# Patient Record
Sex: Male | Born: 1990 | Race: White | Hispanic: No | Marital: Single | Smoking: Never smoker
Health system: Southern US, Community
[De-identification: ages and names within clinical notes are randomized; demographics above are authoritative.]

## PROBLEM LIST (undated history)

## (undated) DIAGNOSIS — K219 Gastro-esophageal reflux disease without esophagitis: Secondary | ICD-10-CM

## (undated) DIAGNOSIS — J45909 Unspecified asthma, uncomplicated: Secondary | ICD-10-CM

## (undated) DIAGNOSIS — I10 Essential (primary) hypertension: Secondary | ICD-10-CM

## (undated) DIAGNOSIS — T7840XA Allergy, unspecified, initial encounter: Secondary | ICD-10-CM

## (undated) HISTORY — DX: Gastro-esophageal reflux disease without esophagitis: K21.9

## (undated) HISTORY — DX: Allergy, unspecified, initial encounter: T78.40XA

## (undated) HISTORY — DX: Essential (primary) hypertension: I10

## (undated) HISTORY — PX: WISDOM TOOTH EXTRACTION: SHX21

## (undated) HISTORY — DX: Unspecified asthma, uncomplicated: J45.909

---
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
## 2014-03-05 ENCOUNTER — Ambulatory Visit (INDEPENDENT_AMBULATORY_CARE_PROVIDER_SITE_OTHER): Payer: Commercial Indemnity | Admitting: Internal Medicine

## 2014-03-05 ENCOUNTER — Encounter: Payer: Self-pay | Admitting: Internal Medicine

## 2014-03-05 VITALS — BP 142/96 | HR 77 | Temp 98.4°F | Ht 71.75 in | Wt 205.5 lb

## 2014-03-05 DIAGNOSIS — Z0000 Encounter for general adult medical examination without abnormal findings: Secondary | ICD-10-CM

## 2014-03-05 DIAGNOSIS — R062 Wheezing: Secondary | ICD-10-CM

## 2014-03-05 MED ORDER — ALBUTEROL SULFATE HFA 108 (90 BASE) MCG/ACT IN AERS: 1.0000 | INHALATION_SPRAY | Freq: Four times a day (QID) | RESPIRATORY_TRACT | Status: DC | PRN

## 2014-03-05 NOTE — Progress Notes (Signed)
Pre visit review using our clinic review tool, if applicable. No additional management support is needed unless otherwise documented below in the visit note. 

## 2014-03-05 NOTE — Progress Notes (Signed)
HPI Pt presents to the clinic today to establish care. He is transferring care from Dr. Sherryll [place]Shah in [place]. He has no concerns today.  Flu: 3013 Tetanus: unsure of date Dentist: biannually  Past Medical History  Diagnosis Date  . Allergy   . GERD (gastroesophageal reflux disease)   . Hypertension     Current Outpatient Prescriptions  Medication Sig Dispense Refill  . albuterol (PROVENTIL HFA;VENTOLIN HFA) 108 (90 BASE) MCG/ACT inhaler Inhale 1-2 puffs into the lungs every 6 (six) hours as needed for wheezing or shortness of breath.      . Lactase (LACTAID PO) Take 1 capsule by mouth daily.       No current facility-administered medications for this visit.    Allergies  Allergen Reactions  . Cephalosporins   . Sulfa Antibiotics Hives    Family History  Problem Relation Age of Onset  . Hypertension Father   . Arthritis Maternal Grandmother   . Diabetes Maternal Grandmother   . Cancer Paternal Grandfather     throat  . Stroke Neg Hx     History   Social History  . Marital Status: Single    Spouse Name: N/A    Number of Children: N/A  . Years of Education: N/A   Occupational History  . Not on file.   Social History Main Topics  . Smoking status: Never Smoker   . Smokeless tobacco: Not on file  . Alcohol Use: 0.6 oz/week    1 Cans of beer per week     Comment: occasional  . Drug Use: No  . Sexual Activity: Yes   Other Topics Concern  . Not on file   Social History Narrative  . No narrative on file    ROS:  Constitutional: Denies fever, malaise, fatigue, headache or abrupt weight changes.  HEENT: Denies eye pain, eye redness, ear pain, ringing in the ears, wax buildup, runny nose, nasal congestion, bloody nose, or sore throat. Respiratory: Denies difficulty breathing, shortness of breath, cough or sputum production.   Cardiovascular: Denies chest pain, chest tightness, palpitations or swelling in the hands or feet.  Gastrointestinal: Denies abdominal  pain, bloating, constipation, diarrhea or blood in the stool.  GU: Denies frequency, urgency, pain with urination, blood in urine, odor or discharge. Musculoskeletal: Denies decrease in range of motion, difficulty with gait, muscle pain or joint pain and swelling.  Skin: Denies redness, rashes, lesions or ulcercations.  Neurological: Denies dizziness, difficulty with memory, difficulty with speech or problems with balance and coordination.   No other specific complaints in a complete review of systems (except as listed in HPI above).  PE:  BP 142/96  Pulse 77  Temp(Src) 98.4 F (36.9 C) (Oral)  Ht 5' 11.75" (1.822 m)  Wt 205 lb 8 oz (93.214 kg)  BMI 28.08 kg/m2  SpO2 99% Wt Readings from Last 3 Encounters:  03/05/14 205 lb 8 oz (93.214 kg)    General: Appears his stated age, well developed, well nourished in NAD. HEENT: Head: normal shape and size; Eyes: sclera white, no icterus, conjunctiva pink, PERRLA and EOMs intact; Ears: Tm's gray and intact, normal light reflex; Nose: mucosa pink and moist, septum midline; Throat/Mouth: Teeth present, mucosa pink and moist, no lesions or ulcerations noted.  Neck: Normal range of motion. Neck supple, trachea midline. No massses, lumps or thyromegaly present.  Cardiovascular: Normal rate and rhythm. S1,S2 noted.  No murmur, rubs or gallops noted. No JVD or BLE edema. No carotid bruits noted. Pulmonary/Chest: Normal  effort and positive vesicular breath sounds. No respiratory distress, intermittent expiratory wheeze noted. No rales or ronchi noted.  Abdomen: Soft and nontender. Normal bowel sounds, no bruits noted. No distention or masses noted. Liver, spleen and kidneys non palpable. Musculoskeletal: Normal range of motion. No signs of joint swelling. No difficulty with gait.  Neurological: Alert and oriented. Cranial nerves II-XII intact. Coordination normal. +DTRs bilaterally. Psychiatric: Mood and affect normal. Behavior is normal. Judgment and  thought content normal.      Assessment and Plan:  Preventative Health Maintenance:  He is unsure of his last tetanus, will get this from his prior PCP All other HM UTD He declines blood work today but would like to come back later this week for labs  RTC in 1 year or sooner if needed

## 2014-03-05 NOTE — Patient Instructions (Addendum)

## 2014-03-06 ENCOUNTER — Other Ambulatory Visit (INDEPENDENT_AMBULATORY_CARE_PROVIDER_SITE_OTHER): Payer: Commercial Indemnity

## 2014-03-06 DIAGNOSIS — Z0000 Encounter for general adult medical examination without abnormal findings: Secondary | ICD-10-CM

## 2014-03-06 LAB — LIPID PANEL
Cholesterol: 124 mg/dL (ref 0–200)
HDL: 40.2 mg/dL (ref >39.00)
LDL Cholesterol: 64 mg/dL (ref 0–99)
Total CHOL/HDL Ratio: 3
Triglycerides: 99 mg/dL (ref 0.0–149.0)
VLDL: 19.8 mg/dL (ref 0.0–40.0)

## 2014-03-06 LAB — CBC
HEMATOCRIT: 43.6 % (ref 39.0–52.0)
Hemoglobin: 14.7 g/dL (ref 13.0–17.0)
MCHC: 33.8 g/dL (ref 30.0–36.0)
MCV: 92.1 fl (ref 78.0–100.0)
PLATELETS: 290 10*3/uL (ref 150.0–400.0)
RBC: 4.73 Mil/uL (ref 4.22–5.81)
RDW: 13 % (ref 11.5–14.6)
WBC: 6.3 10*3/uL (ref 4.5–10.5)

## 2014-03-06 LAB — COMPREHENSIVE METABOLIC PANEL
ALT: 21 U/L (ref 0–53)
AST: 19 U/L (ref 0–37)
Albumin: 4.2 g/dL (ref 3.5–5.2)
Alkaline Phosphatase: 67 U/L (ref 39–117)
BILIRUBIN TOTAL: 0.6 mg/dL (ref 0.3–1.2)
BUN: 10 mg/dL (ref 6–23)
CALCIUM: 9.5 mg/dL (ref 8.4–10.5)
CHLORIDE: 104 meq/L (ref 96–112)
CO2: 30 meq/L (ref 19–32)
CREATININE: 0.9 mg/dL (ref 0.4–1.5)
GFR: 111.85 mL/min (ref >60.00)
Glucose, Bld: 89 mg/dL (ref 70–99)
Potassium: 3.8 mEq/L (ref 3.5–5.1)
Sodium: 140 mEq/L (ref 135–145)
Total Protein: 7.2 g/dL (ref 6.0–8.3)

## 2014-08-14 ENCOUNTER — Ambulatory Visit (INDEPENDENT_AMBULATORY_CARE_PROVIDER_SITE_OTHER): Payer: Commercial Indemnity | Admitting: Internal Medicine

## 2014-08-14 ENCOUNTER — Encounter: Payer: Self-pay | Admitting: Internal Medicine

## 2014-08-14 VITALS — BP 124/68 | HR 82 | Temp 98.6°F | Wt 209.0 lb

## 2014-08-14 DIAGNOSIS — J309 Allergic rhinitis, unspecified: Secondary | ICD-10-CM

## 2014-08-14 NOTE — Patient Instructions (Signed)

## 2014-08-14 NOTE — Progress Notes (Signed)
Pre visit review using our clinic review tool, if applicable. No additional management support is needed unless otherwise documented below in the visit note. 

## 2014-08-14 NOTE — Progress Notes (Signed)
HPI  Pt presents to the clinic today with c/o sore throat, headache and nasal congestion. He reports this started 3 days ago. He has noticed some ost nasal drip and ear fullness as well. He is blowing green mucous out of his nose. He denies fever, chills or body aches. He has tried dayquil and nyquil without any relief. 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 He has not had sick contacts that he is aware of.  Review of Systems    Past Medical History  Diagnosis Date  . Allergy   . GERD (gastroesophageal reflux disease)   . Hypertension     Family History  Problem Relation Age of Onset  . Hypertension Father   . Arthritis Maternal Grandmother   . Diabetes Maternal Grandmother   . Cancer Paternal Grandfather     throat  . Stroke Neg Hx     History   Social History  . Marital Status: Single    Spouse Name: N/A    Number of Children: N/A  . Years of Education: N/A   Occupational History  . Not on file.   Social History Main Topics  . Smoking status: Never Smoker   . Smokeless tobacco: Not on file  . Alcohol Use: 0.6 oz/week    1 Cans of beer per week     Comment: occasional  . Drug Use: No  . Sexual Activity: Yes   Other Topics Concern  . Not on file   Social History Narrative  . No narrative on file    Allergies  Allergen Reactions  . Cephalosporins   . Sulfa Antibiotics Hives     Constitutional: Positive headache, fatigue. Denies fever or abrupt weight changes.  HEENT:  Positive ear fullness, nasal congestion and sore throat. Denies eye redness, ear pain, ringing in the ears, wax buildup, runny nose or bloody nose. Respiratory:  Denies cough, difficulty breathing or shortness of breath.  Cardiovascular: Denies chest pain, chest tightness, palpitations or swelling in the hands or feet.   No other specific complaints in a complete review of systems (except as listed in HPI above).  Objective:  BP 124/68  Pulse 82  Temp(Src) 98.6 F (37 C)  (Oral)  Wt 209 lb (94.802 kg)  SpO2 98%   General: Appears his stated age, well developed, well nourished in NAD. HEENT: Head: normal shape and size, no sinus tenderness noted; Ears: Tm's pink and intact, normal light reflex; Nose: mucosa boggy and moist, septum midline; Throat/Mouth: + PND. Teeth present, mucosa erythematous and moist, no exudate noted, no lesions or ulcerations noted.  Cardiovascular: Normal rate and rhythm. S1,S2 noted.  No murmur, rubs or gallops noted.  Pulmonary/Chest: Normal effort and positive vesicular breath sounds. No respiratory distress. No wheezes, rales or ronchi noted.      Assessment & Plan:   Allergic Rhinitis  Offered him 80 mg Depo IM- he declines Start flonase 2 sprays each nostril for 3 days and then as needed. Start zyrtec as well  RTC as needed or if symptoms persist.

## 2014-11-28 ENCOUNTER — Emergency Department: Payer: Self-pay | Admitting: Emergency Medicine

## 2015-08-19 ENCOUNTER — Ambulatory Visit (INDEPENDENT_AMBULATORY_CARE_PROVIDER_SITE_OTHER): Payer: Commercial Indemnity | Admitting: Internal Medicine

## 2015-08-19 ENCOUNTER — Encounter: Payer: Self-pay | Admitting: Internal Medicine

## 2015-08-19 VITALS — BP 132/80 | HR 76 | Temp 98.2°F | Wt 215.0 lb

## 2015-08-19 DIAGNOSIS — J0100 Acute maxillary sinusitis, unspecified: Secondary | ICD-10-CM

## 2015-08-19 MED ORDER — AMOXICILLIN 500 MG PO CAPS: 500.0000 mg | ORAL_CAPSULE | Freq: Three times a day (TID) | ORAL | Status: DC

## 2015-08-19 NOTE — Progress Notes (Signed)
HPI  Pt presents to the clinic today with c/o headache, facial pain and pressure, nasal congestion, sore throat and cough. This started about 10 days ago. He is blowing green mucous out of his nose. His cough is productive of green mucous. He denies fever but has had chills and body aches. He has tried Dayquil, Scientist, research (physical sciences) with minimal relief. He does have a history of seasonal allergies but denies breathing problems. He has not had sick contacts that he is aware of.  Review of Systems    Past Medical History  Diagnosis Date  . Allergy   . GERD (gastroesophageal reflux disease)   . Hypertension     Family History  Problem Relation Age of Onset  . Hypertension Father   . Arthritis Maternal Grandmother   . Diabetes Maternal Grandmother   . Cancer Paternal Grandfather     throat  . Stroke Neg Hx     Social History   Social History  . Marital Status: Unknown    Spouse Name: N/A  . Number of Children: N/A  . Years of Education: N/A   Occupational History  . Not on file.   Social History Main Topics  . Smoking status: Never Smoker   . Smokeless tobacco: Not on file  . Alcohol Use: 0.6 oz/week    1 Cans of beer per week     Comment: occasional  . Drug Use: No  . Sexual Activity: Yes   Other Topics Concern  . Not on file   Social History Narrative    Allergies  Allergen Reactions  . Cephalosporins   . Sulfa Antibiotics Hives     Constitutional: Positive headache, fatigue. Denies fever or abrupt weight changes.  HEENT:  Positive facial pain, nasal congestion and sore throat. Denies eye redness, ear pain, ringing in the ears, wax buildup, runny nose or bloody nose. Respiratory: Positive cough. Denies difficulty breathing or shortness of breath.  Cardiovascular: Denies chest pain, chest tightness, palpitations or swelling in the hands or feet.   No other specific complaints in a complete review of systems (except as listed in HPI above).  Objective:  BP  132/80 mmHg  Pulse 76  Temp(Src) 98.2 F (36.8 C) (Oral)  Wt 215 lb (97.523 kg)  SpO2 98%   General: Appears his stated age, ill appearing in NAD. HEENT: Head: normal shape and size, maxillary sinus tenderness noted; Eyes: sclera white, no icterus, conjunctiva pink; Ears: Tm's gray and intact, normal light reflex; Nose: mucosa boggy and moist, septum midline; Throat/Mouth: + PND. Teeth present, mucosa erythematous and moist, no exudate noted, no lesions or ulcerations noted.  Neck:  No cervical adenopathy noted.  Cardiovascular: Normal rate and rhythm. S1,S2 noted.  No murmur, rubs or gallops noted.  Pulmonary/Chest: Normal effort and positive vesicular breath sounds. No respiratory distress. No wheezes, rales or ronchi noted.      Assessment & Plan:   Acute maxillary sinusitis  Can use a Neti Pot which can be purchased from your local drug store. Flonase 2 sprays each nostril for 3 days and then as needed. Amoxil TID for 10 days  RTC as needed or if symptoms persist.

## 2015-08-19 NOTE — Patient Instructions (Signed)

## 2015-08-19 NOTE — Progress Notes (Signed)
Pre visit review using our clinic review tool, if applicable. No additional management support is needed unless otherwise documented below in the visit note. 

## 2015-10-07 ENCOUNTER — Ambulatory Visit (INDEPENDENT_AMBULATORY_CARE_PROVIDER_SITE_OTHER): Payer: Commercial Indemnity | Admitting: Internal Medicine

## 2015-10-07 ENCOUNTER — Encounter: Payer: Self-pay | Admitting: Internal Medicine

## 2015-10-07 VITALS — BP 128/84 | HR 84 | Temp 98.1°F | Wt 212.0 lb

## 2015-10-07 DIAGNOSIS — J0101 Acute recurrent maxillary sinusitis: Secondary | ICD-10-CM

## 2015-10-07 MED ORDER — CLARITHROMYCIN 500 MG PO TABS: 500.0000 mg | ORAL_TABLET | Freq: Two times a day (BID) | ORAL | Status: DC

## 2015-10-07 NOTE — Patient Instructions (Signed)

## 2015-10-07 NOTE — Progress Notes (Signed)
HPI  Pt presents to the clinic today with c/o headache, facial pain and pressure, nasal congestion and sore throat. This started 5 days ago. He is blowing green mucous out of his nose. He denies fever, chills or body aches. He has tried Dayquil and Nyquil without any relief. He does have a history of allergies. He was treated for a sinus infection with Amoxil 10/4. He has not had sick contacts that he is aware of.  Review of Systems    Past Medical History  Diagnosis Date  . Allergy   . GERD (gastroesophageal reflux disease)   . Hypertension     Family History  Problem Relation Age of Onset  . Hypertension Father   . Arthritis Maternal Grandmother   . Diabetes Maternal Grandmother   . Cancer Paternal Grandfather     throat  . Stroke Neg Hx     Social History   Social History  . Marital Status: Unknown    Spouse Name: N/A  . Number of Children: N/A  . Years of Education: N/A   Occupational History  . Not on file.   Social History Main Topics  . Smoking status: Never Smoker   . Smokeless tobacco: Not on file  . Alcohol Use: 0.6 oz/week    1 Cans of beer per week     Comment: occasional  . Drug Use: No  . Sexual Activity: Yes   Other Topics Concern  . Not on file   Social History Narrative    Allergies  Allergen Reactions  . Cephalosporins   . Sulfa Antibiotics Hives     Constitutional: Positive headache. Denies fatigue, fever or abrupt weight changes.  HEENT:  Positive eye pain, facial pain, nasal congestion and sore throat. Denies eye redness, ear pain, ringing in the ears, wax buildup, runny nose or bloody nose. Respiratory: Positive cough. Denies difficulty breathing or shortness of breath.  Cardiovascular: Denies chest pain, chest tightness, palpitations or swelling in the hands or feet.   No other specific complaints in a complete review of systems (except as listed in HPI above).  Objective:  BP 128/84 mmHg  Pulse 84  Temp(Src) 98.1 F (36.7 C)  (Oral)  Wt 212 lb (96.163 kg)  SpO2 98%   General: Appears his stated age, ill appearing in NAD. HEENT: Head: normal shape and size, maxillary sinus tenderness noted; Eyes: sclera white, no icterus, conjunctiva pink; Ears: Tm's pink but intact, normal light reflex; Nose: mucosa boggy and moist, septum midline; Throat/Mouth: + PND. Teeth present, mucosa erythematous and moist, no exudate noted, no lesions or ulcerations noted.  Neck:  Anterior cervical adenopathy noted.  Cardiovascular: Normal rate and rhythm. S1,S2 noted.  No murmur, rubs or gallops noted.  Pulmonary/Chest: Normal effort and positive vesicular breath sounds. No respiratory distress. No wheezes, rales or ronchi noted.      Assessment & Plan:   Acute bacterial sinusitis  Can use a Neti Pot which can be purchased from your local drug store. Flonase 2 sprays each nostril for 3 days and then as needed. Zyrtec every day  2 weeks Biaxin BID for 10 days  RTC as needed or if symptoms persist.

## 2015-10-07 NOTE — Progress Notes (Signed)
Pre visit review using our clinic review tool, if applicable. No additional management support is needed unless otherwise documented below in the visit note. 

## 2016-03-26 ENCOUNTER — Encounter: Payer: Self-pay | Admitting: Internal Medicine

## 2016-03-26 ENCOUNTER — Ambulatory Visit (INDEPENDENT_AMBULATORY_CARE_PROVIDER_SITE_OTHER): Payer: Managed Care, Other (non HMO) | Admitting: Internal Medicine

## 2016-03-26 VITALS — BP 130/84 | HR 69 | Temp 98.4°F | Wt 215.5 lb

## 2016-03-26 DIAGNOSIS — M6580 Other synovitis and tenosynovitis, unspecified site: Secondary | ICD-10-CM | POA: Diagnosis not present

## 2016-03-26 DIAGNOSIS — M779 Enthesopathy, unspecified: Secondary | ICD-10-CM

## 2016-03-26 DIAGNOSIS — M76892 Other specified enthesopathies of left lower limb, excluding foot: Secondary | ICD-10-CM

## 2016-03-26 NOTE — Patient Instructions (Signed)
Tendinitis °Tendinitis is swelling and inflammation of the tendons. Tendons are band-like tissues that connect muscle to bone. Tendinitis commonly occurs in the:  °· Shoulders (rotator cuff). °· Heels (Achilles tendon). °· Elbows (triceps tendon). °CAUSES °Tendinitis is usually caused by overusing the tendon, muscles, and joints involved. When the tissue surrounding a tendon (synovium) becomes inflamed, it is called tenosynovitis. Tendinitis commonly develops in people whose jobs require repetitive motions. °SYMPTOMS °· Pain. °· Tenderness. °· Mild swelling. °DIAGNOSIS °Tendinitis is usually diagnosed by physical exam. Your health care provider may also order X-rays or other imaging tests. °TREATMENT °Your health care provider may recommend certain medicines or exercises for your treatment. °HOME CARE INSTRUCTIONS  °· Use a sling or splint for as long as directed by your health care provider until the pain decreases. °· Put ice on the injured area. °¨ Put ice in a plastic bag. °¨ Place a towel between your skin and the bag. °¨ Leave the ice on for 15-20 minutes, 3-4 times a day, or as directed by your health care provider. °· Avoid using the limb while the tendon is painful. Perform gentle range of motion exercises only as directed by your health care provider. Stop exercises if pain or discomfort increase, unless directed otherwise by your health care provider. °· Only take over-the-counter or prescription medicines for pain, discomfort, or fever as directed by your health care provider. °SEEK MEDICAL CARE IF:  °· Your pain and swelling increase. °· You develop new, unexplained symptoms, especially increased numbness in the hands. °MAKE SURE YOU:  °· Understand these instructions. °· Will watch your condition. °· Will get help right away if you are not doing well or get worse. °  °This information is not intended to replace advice given to you by your health care provider. Make sure you discuss any questions you  have with your health care provider. °  °Document Released: 10/29/2000 Document Revised: 11/22/2014 Document Reviewed: 01/18/2011 °Elsevier Interactive Patient Education ©2016 [employer] ° °

## 2016-03-26 NOTE — Progress Notes (Signed)
Pre visit review using our clinic review tool, if applicable. No additional management support is needed unless otherwise documented below in the visit note. 

## 2016-03-26 NOTE — Progress Notes (Signed)
Subjective:    Patient ID: Cameron Walker, male    DOB: 1991-11-14, 25 y.o.   MRN: [id number]  HPI  Pt presents to the clinic today with c/o left knee pain. This has been intermittent over the last 6 months. He describes the pain as dull and achy, like pressure. The pain is worse with weight bearing, better when he sits down. He denies joint swelling. He has tried Ibuprofen and stretches with some relief. He has not had any injury to the area.  He also reports neck pain. This started a few weeks ago. He describes the pain as sore. He has been to a chiropractor twice. He had an xray done, which showed a bone spur of his cervical spine. He is not sure if he should be concerned about this or not.  Review of Systems      Past Medical History  Diagnosis Date  . Allergy   . GERD (gastroesophageal reflux disease)   . Hypertension     Current Outpatient Prescriptions  Medication Sig Dispense Refill  . fluticasone (FLONASE) 50 MCG/ACT nasal spray Place 2 sprays into both nostrils daily.     No current facility-administered medications for this visit.    Allergies  Allergen Reactions  . Cephalosporins   . Sulfa Antibiotics Hives    Family History  Problem Relation Age of Onset  . Hypertension Father   . Arthritis Maternal Grandmother   . Diabetes Maternal Grandmother   . Cancer Paternal Grandfather     throat  . Stroke Neg Hx     Social History   Social History  . Marital Status: Unknown    Spouse Name: N/A  . Number of Children: N/A  . Years of Education: N/A   Occupational History  . Not on file.   Social History Main Topics  . Smoking status: Never Smoker   . Smokeless tobacco: Not on file  . Alcohol Use: 0.6 oz/week    1 Cans of beer per week     Comment: occasional  . Drug Use: No  . Sexual Activity: Yes   Other Topics Concern  . Not on file   Social History Narrative     Constitutional: Denies fever, malaise, fatigue, headache or abrupt weight  changes.  Respiratory: Denies difficulty breathing, shortness of breath, cough or sputum production.   Cardiovascular: Denies chest pain, chest tightness, palpitations or swelling in the hands or feet.  Musculoskeletal: Pt reports neck pain and left knee pain. Denies decrease in range of motion, difficulty with gait, muscle pain or joint swelling.   No other specific complaints in a complete review of systems (except as listed in HPI above).  Objective:   Physical Exam   BP 130/84 mmHg  Pulse 69  Temp(Src) 98.4 F (36.9 C) (Oral)  Wt 215 lb 8 oz (97.75 kg)  SpO2 98% Wt Readings from Last 3 Encounters:  03/26/16 215 lb 8 oz (97.75 kg)  10/07/15 212 lb (96.163 kg)  08/19/15 215 lb (97.523 kg)    General: Appears his stated age, well developed, well nourished in NAD. Cardiovascular: Normal rate and rhythm. S1,S2 noted.  No murmur, rubs or gallops noted. . Pulmonary/Chest: Normal effort and positive vesicular breath sounds. No respiratory distress. No wheezes, rales or ronchi noted.  Musculoskeletal: Normal flexion and extension of the left knee. Negative McMurray. Negative Lachman's. Pain with palpation of the patellar-tibial tendon. No signs of joint swelling. No difficulty with gait. Normal flexion, extension and rotation of  the cervical spine. No bony tenderness noted.  BMET    Component Value Date/Time   NA 140 03/06/2014 0739   K 3.8 03/06/2014 0739   CL 104 03/06/2014 0739   CO2 30 03/06/2014 0739   GLUCOSE 89 03/06/2014 0739   BUN 10 03/06/2014 0739   CREATININE 0.9 03/06/2014 0739   CALCIUM 9.5 03/06/2014 0739    Lipid Panel     Component Value Date/Time   CHOL 124 03/06/2014 0739   TRIG 99.0 03/06/2014 0739   HDL 40.20 03/06/2014 0739   CHOLHDL 3 03/06/2014 0739   VLDL 19.8 03/06/2014 0739   LDLCALC 64 03/06/2014 0739    CBC    Component Value Date/Time   WBC 6.3 03/06/2014 0739   RBC 4.73 03/06/2014 0739   HGB 14.7 03/06/2014 0739   HCT 43.6 03/06/2014  0739   PLT 290.0 03/06/2014 0739   MCV 92.1 03/06/2014 0739   MCHC 33.8 03/06/2014 0739   RDW 13.0 03/06/2014 0739    Hgb A1C No results found for: HGBA1C      Assessment & Plan:   Left knee pain:  Concern for tendonitis Discussed RICE If persist, we can have him evaluated by Dr. Patsy Lager  Bone spur cervical spine:  No intervention needed Will continue to monitor  RTC as needed or if symptoms persist or worsen

## 2016-06-03 ENCOUNTER — Encounter: Payer: Self-pay | Admitting: Podiatry

## 2016-06-03 ENCOUNTER — Ambulatory Visit (INDEPENDENT_AMBULATORY_CARE_PROVIDER_SITE_OTHER): Payer: Managed Care, Other (non HMO)

## 2016-06-03 ENCOUNTER — Ambulatory Visit (INDEPENDENT_AMBULATORY_CARE_PROVIDER_SITE_OTHER): Payer: Managed Care, Other (non HMO) | Admitting: Podiatry

## 2016-06-03 DIAGNOSIS — R52 Pain, unspecified: Secondary | ICD-10-CM

## 2016-06-03 DIAGNOSIS — M722 Plantar fascial fibromatosis: Secondary | ICD-10-CM

## 2016-06-03 NOTE — Patient Instructions (Signed)

## 2016-06-03 NOTE — Progress Notes (Signed)
   Subjective:    Patient ID: Cameron Walker, male    DOB: 11-21-1990, 25 y.o.   MRN: [id number]  HPI  25 year old male presents the also concerns of bilateral arch pain and foot pain which is been ongoing for about 2 months. He states he has pain with walking. He has no pain in the morning when he gets up. He is tried changing shoes does not help. He had orthotics to use a kid but not any recently he is requesting orthotics today. Denies any numbness or tingling. No swelling or redness. No complaints this time.  Review of Systems  All other systems reviewed and are negative.      Objective:   Physical Exam General: AAO x3, NAD  Dermatological: Skin is warm, dry and supple bilateral. Nails x 10 are well manicured; remaining integument appears unremarkable at this time. There are no open sores, no preulcerative lesions, no rash or signs of infection present.  Vascular: Dorsalis Pedis artery and Posterior Tibial artery pedal pulses are 2/4 bilateral with immedate capillary fill time. Pedal hair growth present. There is no pain with calf compression, swelling, warmth, erythema.   Neruologic: Grossly intact via light touch bilateral. Vibratory intact via tuning fork bilateral. Protective threshold with Semmes Wienstein monofilament intact to all pedal sites bilateral.   Musculoskeletal: There is a mild decrease in medial arch height upon weightbearing. Is mild to the medial band of the plantar fascial in the arch of the foot. There is slight discomfort along the insertion of the calcaneus. No pain with lateral compression of the calcaneus. No other areas of tenderness bilaterally. MMT 5/5. Equinus is present. Range of motion intact.  Gait: Unassisted, Nonantalgic.      Assessment & Plan:  25 year old male bilateral arch pain, plantar fasciitis -Treatment options discussed including all alternatives, risks, and complications -Etiology of symptoms were discussed -X-rays were obtained and  reviewed with the patient. No evidence of acute fracture or stress fracture -States he is not want steroid injection and he wishes to hold off on any prescription anti-inflammatory. He will continue over-the-counter ibuprofen as needed. -He was scanned for orthotics today and sent to [facility] -Stretching and icing exercises daily. Also discussed shoe gear modifications -Follow-up in 4 to pick up orthotics or sooner if needed  Ovid Curd, DPM

## 2016-06-16 ENCOUNTER — Ambulatory Visit (INDEPENDENT_AMBULATORY_CARE_PROVIDER_SITE_OTHER): Payer: Managed Care, Other (non HMO) | Admitting: Podiatry

## 2016-06-16 DIAGNOSIS — M722 Plantar fascial fibromatosis: Secondary | ICD-10-CM

## 2016-06-28 NOTE — Progress Notes (Signed)
Patient presents to [facility]. Was seen by [facility]. Follow-up in 4 weeks.

## 2016-07-29 ENCOUNTER — Ambulatory Visit: Payer: Managed Care, Other (non HMO) | Admitting: Podiatry

## 2016-11-30 ENCOUNTER — Ambulatory Visit (INDEPENDENT_AMBULATORY_CARE_PROVIDER_SITE_OTHER): Payer: Managed Care, Other (non HMO) | Admitting: Internal Medicine

## 2016-11-30 ENCOUNTER — Encounter: Payer: Managed Care, Other (non HMO) | Admitting: Internal Medicine

## 2016-11-30 ENCOUNTER — Encounter: Payer: Self-pay | Admitting: Internal Medicine

## 2016-11-30 VITALS — BP 126/82 | HR 74 | Temp 98.2°F | Ht 72.5 in | Wt 217.8 lb

## 2016-11-30 DIAGNOSIS — K219 Gastro-esophageal reflux disease without esophagitis: Secondary | ICD-10-CM

## 2016-11-30 DIAGNOSIS — Z0000 Encounter for general adult medical examination without abnormal findings: Secondary | ICD-10-CM

## 2016-11-30 DIAGNOSIS — Z114 Encounter for screening for human immunodeficiency virus [HIV]: Secondary | ICD-10-CM | POA: Diagnosis not present

## 2016-11-30 DIAGNOSIS — Z23 Encounter for immunization: Secondary | ICD-10-CM

## 2016-11-30 DIAGNOSIS — J301 Allergic rhinitis due to pollen: Secondary | ICD-10-CM | POA: Diagnosis not present

## 2016-11-30 DIAGNOSIS — I10 Essential (primary) hypertension: Secondary | ICD-10-CM | POA: Insufficient documentation

## 2016-11-30 DIAGNOSIS — J302 Other seasonal allergic rhinitis: Secondary | ICD-10-CM | POA: Insufficient documentation

## 2016-11-30 LAB — LIPID PANEL
CHOL/HDL RATIO: 4
Cholesterol: 172 mg/dL (ref 0–200)
HDL: 45.9 mg/dL (ref >39.00)
LDL CALC: 105 mg/dL — AB (ref 0–99)
NONHDL: 126.43
TRIGLYCERIDES: 109 mg/dL (ref 0.0–149.0)
VLDL: 21.8 mg/dL (ref 0.0–40.0)

## 2016-11-30 LAB — COMPREHENSIVE METABOLIC PANEL
ALT: 37 U/L (ref 0–53)
AST: 24 U/L (ref 0–37)
Albumin: 4.6 g/dL (ref 3.5–5.2)
Alkaline Phosphatase: 65 U/L (ref 39–117)
BUN: 13 mg/dL (ref 6–23)
CALCIUM: 9.9 mg/dL (ref 8.4–10.5)
CHLORIDE: 96 meq/L (ref 96–112)
CO2: 31 meq/L (ref 19–32)
Creatinine, Ser: 1.01 mg/dL (ref 0.40–1.50)
GFR: 94.55 mL/min (ref >60.00)
GLUCOSE: 89 mg/dL (ref 70–99)
POTASSIUM: 4.5 meq/L (ref 3.5–5.1)
Sodium: 133 mEq/L — ABNORMAL LOW (ref 135–145)
Total Bilirubin: 0.4 mg/dL (ref 0.2–1.2)
Total Protein: 7.3 g/dL (ref 6.0–8.3)

## 2016-11-30 LAB — HEMOGLOBIN A1C: Hgb A1c MFr Bld: 5.4 % (ref 4.6–6.5)

## 2016-11-30 LAB — CBC
HCT: 43.6 % (ref 39.0–52.0)
HEMOGLOBIN: 15.1 g/dL (ref 13.0–17.0)
MCHC: 34.6 g/dL (ref 30.0–36.0)
MCV: 90.4 fl (ref 78.0–100.0)
PLATELETS: 303 10*3/uL (ref 150.0–400.0)
RBC: 4.82 Mil/uL (ref 4.22–5.81)
RDW: 13.1 % (ref 11.5–15.5)
WBC: 6 10*3/uL (ref 4.0–10.5)

## 2016-11-30 NOTE — Assessment & Plan Note (Signed)
Discussed BP goal of < 120/80 He does not want to start medication at this time Will give him 3 months to work on weight loss, low salt diet, and cutting out caffeine He does not smoke or drink alcohol

## 2016-11-30 NOTE — Patient Instructions (Signed)

## 2016-11-30 NOTE — Assessment & Plan Note (Signed)
Discussed avoiding triggers Discussed how weight loss could help improve his reflux Discussed how Nexium is made to be taken daily not prn. If reflux is not occurring at least 3 x a week, he could just use Tums or Rolaids OTC in addition to diet modification.

## 2016-11-30 NOTE — Addendum Note (Signed)
Addended by: Roena Malady on: 11/30/2016 09:21 AM   Modules accepted: Orders

## 2016-11-30 NOTE — Progress Notes (Signed)
Subjective:    Patient ID: Cameron Walker, male    DOB: 1991-09-11, 26 y.o.   MRN: [id number]  HPI  Pt presents to the clinic today for his annual exam. He is also due for follow up of chronic conditions.  Seasonal Allergies: Worse in the Fall. He takes Zyrtec and Flonase as needed with good relief.   GERD: Triggered by eating late at night or laying down after eating. Also triggered by tomato based products. He takes Nexium as needed with good relief. He takes Tums as needed.  HTN: His BP today is 126/82. His last 3 blood pressures were 130/84, 128/84, 132/80. He has never taken blood pressure medication. He denies headache, dizziness, blurred vision, chest pain or shortness of breath.  Flu: ?2013 Tetanus: unsure Dentist: annually  Diet: He does eat meat. He consumes fruits and veggies daily. He does eat some fried foods. He drinks mostly water. Exercise: None in the last 3 weeks. He usually does weight lifting, stationary bike at least 5-6 days a week.  Review of Systems      Past Medical History:  Diagnosis Date  . Allergy   . GERD (gastroesophageal reflux disease)   . Hypertension     Current Outpatient Prescriptions  Medication Sig Dispense Refill  . Cetirizine HCl (ZYRTEC ALLERGY) 10 MG CAPS Take by mouth.    . esomeprazole (NEXIUM) 20 MG capsule Take 20 mg by mouth daily at 12 noon.    . fluticasone (FLONASE) 50 MCG/ACT nasal spray Place 2 sprays into both nostrils daily.     No current facility-administered medications for this visit.     Allergies  Allergen Reactions  . Cephalosporins   . Sulfa Antibiotics Hives    Family History  Problem Relation Age of Onset  . Hypertension Father   . Arthritis Maternal Grandmother   . Diabetes Maternal Grandmother   . Cancer Paternal Grandfather     throat  . Stroke Neg Hx     Social History   Social History  . Marital status: Unknown    Spouse name: N/A  . Number of children: N/A  . Years of education: N/A    Occupational History  . Not on file.   Social History Main Topics  . Smoking status: Never Smoker  . Smokeless tobacco: Not on file  . Alcohol use 0.6 oz/week    1 Cans of beer per week     Comment: occasional  . Drug use: No  . Sexual activity: Yes   Other Topics Concern  . Not on file   Social History Narrative  . No narrative on file     Constitutional: Denies fever, malaise, fatigue, headache or abrupt weight changes.  HEENT: Pt reports nasal congestion. Denies eye pain, eye redness, ear pain, ringing in the ears, wax buildup, runny nose, bloody nose, or sore throat. Respiratory: Denies difficulty breathing, shortness of breath, cough or sputum production.   Cardiovascular: Denies chest pain, chest tightness, palpitations or swelling in the hands or feet.  Gastrointestinal: Pt reports history of external hemorrhoids, none currently. He also reports intermitent reflux. Denies abdominal pain, bloating, constipation, diarrhea or blood in the stool.  GU: Denies urgency, frequency, pain with urination, burning sensation, blood in urine, odor or discharge. Musculoskeletal: Pt reports intermittent right shoulder and left knee pain. Denies decrease in range of motion, difficulty with gait, muscle pain or joint  swelling.  Skin: Denies redness, rashes, lesions or ulcercations.  Neurological: Denies dizziness, difficulty with  memory, difficulty with speech or problems with balance and coordination.  Psych: Denies anxiety, depression, SI/HI.  No other specific complaints in a complete review of systems (except as listed in HPI above).  Objective:   Physical Exam   BP 126/82   Pulse 74   Temp 98.2 F (36.8 C) (Oral)   Ht 6' 0.5" (1.842 m)   Wt 217 lb 12 oz (98.8 kg)   SpO2 98%   BMI 29.13 kg/m  Wt Readings from Last 3 Encounters:  11/30/16 217 lb 12 oz (98.8 kg)  03/26/16 215 lb 8 oz (97.8 kg)  10/07/15 212 lb (96.2 kg)    General: Appears his stated age, well  developed, well nourished in NAD. Skin: Warm, dry and intact.  HEENT: Head: normal shape and size; Eyes: sclera white, no icterus, conjunctiva pink, PERRLA and EOMs intact; Ears: Tm's gray and intact, normal light reflex; Nose: mucosa pink and moist, septum midline; Throat/Mouth: Teeth present, mucosa pink and moist, no exudate, lesions or ulcerations noted.  Neck:  Neck supple, trachea midline. No masses, lumps or thyromegaly present.  Cardiovascular: Normal rate and rhythm. S1,S2 noted.  No murmur, rubs or gallops noted. No JVD or BLE edema.  Pulmonary/Chest: Normal effort and positive vesicular breath sounds. No respiratory distress. No wheezes, rales or ronchi noted.  Abdomen: Soft and nontender. Normal bowel sounds. No distention or masses noted. Liver, spleen and kidneys non palpable. Musculoskeletal: Normal range of motion. No signs of joint swelling. Strength 5/5 BUE/BLE. No difficulty with gait.  Neurological: Alert and oriented. Cranial nerves II-XII grossly intact. Coordination normal.  Psychiatric: Mood and affect normal. Behavior is normal. Judgment and thought content normal.    BMET    Component Value Date/Time   NA 140 03/06/2014 0739   K 3.8 03/06/2014 0739   CL 104 03/06/2014 0739   CO2 30 03/06/2014 0739   GLUCOSE 89 03/06/2014 0739   BUN 10 03/06/2014 0739   CREATININE 0.9 03/06/2014 0739   CALCIUM 9.5 03/06/2014 0739    Lipid Panel     Component Value Date/Time   CHOL 124 03/06/2014 0739   TRIG 99.0 03/06/2014 0739   HDL 40.20 03/06/2014 0739   CHOLHDL 3 03/06/2014 0739   VLDL 19.8 03/06/2014 0739   LDLCALC 64 03/06/2014 0739    CBC    Component Value Date/Time   WBC 6.3 03/06/2014 0739   RBC 4.73 03/06/2014 0739   HGB 14.7 03/06/2014 0739   HCT 43.6 03/06/2014 0739   PLT 290.0 03/06/2014 0739   MCV 92.1 03/06/2014 0739   MCHC 33.8 03/06/2014 0739   RDW 13.0 03/06/2014 0739    Hgb A1C No results found for: HGBA1C         Assessment & Plan:    Preventative Health Maintenance:  Flu and tetanus booster today Encouraged him to consume a balanced diet and exercise regimen Advised him to see a dentist annually Will check CBC, CMET, Lipid, A1C and HIV today  RTC in 3 months to follow up HTN, 1 year for annual exam Nicki Reaper, NP

## 2016-11-30 NOTE — Assessment & Plan Note (Signed)
Ok to continue Zyrtec and Flonase prn

## 2016-12-01 LAB — HIV ANTIBODY (ROUTINE TESTING W REFLEX): HIV: NONREACTIVE

## 2016-12-07 ENCOUNTER — Telehealth: Payer: Self-pay | Admitting: Internal Medicine

## 2016-12-07 NOTE — Telephone Encounter (Signed)
Pt returned call from [facility] and is requesting a cb.

## 2017-01-07 ENCOUNTER — Ambulatory Visit (INDEPENDENT_AMBULATORY_CARE_PROVIDER_SITE_OTHER): Payer: Managed Care, Other (non HMO) | Admitting: Family Medicine

## 2017-01-07 ENCOUNTER — Encounter (INDEPENDENT_AMBULATORY_CARE_PROVIDER_SITE_OTHER): Payer: Self-pay

## 2017-01-07 ENCOUNTER — Encounter: Payer: Self-pay | Admitting: Family Medicine

## 2017-01-07 DIAGNOSIS — K2970 Gastritis, unspecified, without bleeding: Secondary | ICD-10-CM | POA: Diagnosis not present

## 2017-01-07 NOTE — Assessment & Plan Note (Addendum)
Rest, fluids,  Focus on hydration. Offered antiemetic.Marland Kitchen Pt refused.

## 2017-01-07 NOTE — Progress Notes (Signed)
   Subjective:    Patient ID: Cameron Walker, male    DOB: Nov 30, 1990, 26 y.o.   MRN: [id number]  Emesis   Pertinent negatives include no abdominal pain, chest pain, coughing or fever.    26 year old male  Pt of Rene Kocher with history of GERD present  With new onset nausea in last 5 days. Associated with dizziness, tension headache. No abd pain, no diarrhea.  No fever.  No cough,  Mild nasal congestion.  No heartburn  Emesis off and on.  No exposures at restaurant.  Restarted nexium 20 mg daily 2 days ago, using Tums off and on. He has been able to keep down liquids, small at a time.   No sick contacts.  Blood pressure (!) 150/94, pulse 75, temperature 98.7 F (37.1 C), temperature source Oral, height 6' 0.5" (1.842 m), weight 221 lb 4 oz (100.4 kg). BP Readings from Last 3 Encounters:  01/07/17 (!) 150/94  11/30/16 126/82  03/26/16 130/84      Review of Systems  Constitutional: Positive for fatigue. Negative for fever.  HENT: Negative for ear pain.   Eyes: Negative for pain.  Respiratory: Negative for cough and shortness of breath.   Cardiovascular: Negative for chest pain.  Gastrointestinal: Positive for vomiting. Negative for abdominal pain and blood in stool.       Objective:   Physical Exam  Constitutional: Vital signs are normal. He appears well-developed and well-nourished. He appears ill.  HENT:  Head: Normocephalic.  Right Ear: Hearing normal.  Left Ear: Hearing normal.  Nose: Nose normal.  Mouth/Throat: Oropharynx is clear and moist and mucous membranes are normal.  Neck: Trachea normal. Carotid bruit is not present. No thyroid mass and no thyromegaly present.  Cardiovascular: Normal rate, regular rhythm and normal pulses.  Exam reveals no gallop, no distant heart sounds and no friction rub.   No murmur heard. No peripheral edema  Pulmonary/Chest: Effort normal and breath sounds normal. No respiratory distress.  Skin: Skin is warm, dry and intact. No rash  noted.  Psychiatric: He has a normal mood and affect. His speech is normal and behavior is normal. Thought content normal.          Assessment & Plan:

## 2017-01-07 NOTE — Progress Notes (Signed)
Pre visit review using our clinic review tool, if applicable. No additional management support is needed unless otherwise documented below in the visit note. 

## 2017-01-07 NOTE — Patient Instructions (Signed)
Rest, fluids. gradaully increase liquids as able over time.  Continue nexium as able., Return to regular diet as tolerated.

## 2017-02-28 ENCOUNTER — Ambulatory Visit: Payer: Managed Care, Other (non HMO) | Admitting: Internal Medicine

## 2017-03-07 ENCOUNTER — Encounter: Payer: Self-pay | Admitting: Internal Medicine

## 2017-03-07 ENCOUNTER — Ambulatory Visit (INDEPENDENT_AMBULATORY_CARE_PROVIDER_SITE_OTHER): Payer: Managed Care, Other (non HMO) | Admitting: Internal Medicine

## 2017-03-07 VITALS — BP 124/78 | HR 74 | Temp 98.4°F | Wt 215.2 lb

## 2017-03-07 DIAGNOSIS — R030 Elevated blood-pressure reading, without diagnosis of hypertension: Secondary | ICD-10-CM

## 2017-03-07 NOTE — Patient Instructions (Signed)
How to Take Your Blood Pressure You can take your blood pressure at home with a machine. You may need to check your blood pressure at home:  To check if you have high blood pressure (hypertension).  To check your blood pressure over time.  To make sure your blood pressure medicine is working. Supplies needed: You will need a blood pressure machine, or monitor. You can buy one at a drugstore or online. When choosing one:  Choose one with an arm cuff.  Choose one that wraps around your upper arm. Only one finger should fit between your arm and the cuff.  Do not choose one that measures your blood pressure from your wrist or finger. Your doctor can suggest a monitor. How to prepare Avoid these things for 30 minutes before checking your blood pressure:  Drinking caffeine.  Drinking alcohol.  Eating.  Smoking.  Exercising. Five minutes before checking your blood pressure:  Pee.  Sit in a dining chair. Avoid sitting in a soft couch or armchair.  Be quiet. Do not talk. How to take your blood pressure Follow the instructions that came with your machine. If you have a digital blood pressure monitor, these may be the instructions: 1. Sit up straight. 2. Place your feet on the floor. Do not cross your ankles or legs. 3. Rest your left arm at the level of your heart. You may rest it on a table, desk, or chair. 4. Pull up your shirt sleeve. 5. Wrap the blood pressure cuff around the upper part of your left arm. The cuff should be 1 inch (2.5 cm) above your elbow. It is best to wrap the cuff around bare skin. 6. Fit the cuff snugly around your arm. You should be able to place only one finger between the cuff and your arm. 7. Put the cord inside the groove of your elbow. 8. Press the power button. 9. Sit quietly while the cuff fills with air and loses air. 10. Write down the numbers on the screen. 11. Wait 2-3 minutes and then repeat steps 1-10. What do the numbers mean? Two  numbers make up your blood pressure. The first number is called systolic pressure. The second is called diastolic pressure. An example of a blood pressure reading is "120 over 80" (or 120/80). If you are an adult and do not have a medical condition, use this guide to find out if your blood pressure is normal: Normal   First number: below 120.  Second number: below 80. Elevated   First number: 120-129.  Second number: below 80. Hypertension stage 1   First number: 130-139.  Second number: 80-89. Hypertension stage 2   First number: 140 or above.  Second number: 90 or above. Your blood pressure is above normal even if only the top or bottom number is above normal. Follow these instructions at home:  Check your blood pressure as often as your doctor tells you to.  Take your monitor to your next doctor's appointment. Your doctor will:  Make sure you are using it correctly.  Make sure it is working right.  Make sure you understand what your blood pressure numbers should be.  Tell your doctor if your medicines are causing side effects. Contact a doctor if:  Your blood pressure keeps being high. Get help right away if:  Your first blood pressure number is higher than 180.  Your second blood pressure number is higher than 120. This information is not intended to replace advice given to   you by your health care provider. Make sure you discuss any questions you have with your health care provider. Document Released: 10/14/2008 Document Revised: 09/29/2016 Document Reviewed: 04/09/2016 Elsevier Interactive Patient Education  2017 Elsevier Inc.  

## 2017-03-07 NOTE — Progress Notes (Signed)
Subjective:    Patient ID: Cameron Walker, male    DOB: 03/29/1991, 26 y.o.   MRN: [id number]  HPI  Pt presents to the clinic today to follow up HTN. His BP at his last visit was 150/94. He has been monitoring his BP at home with a wrist cuff and an arm cuff. His BP at home has been 110-190 systolic. His BP today is 124/78. He denies dizziness, headaches, visual changes, chest tightness or shortness of breath.  Review of Systems      Past Medical History:  Diagnosis Date  . Allergy   . GERD (gastroesophageal reflux disease)   . Hypertension     Current Outpatient Prescriptions  Medication Sig Dispense Refill  . Cetirizine HCl (ZYRTEC ALLERGY) 10 MG CAPS Take by mouth.    . esomeprazole (NEXIUM) 20 MG capsule Take 20 mg by mouth daily at 12 noon.    . fluticasone (FLONASE) 50 MCG/ACT nasal spray Place 2 sprays into both nostrils daily.     No current facility-administered medications for this visit.     Allergies  Allergen Reactions  . Cephalosporins   . Sulfa Antibiotics Hives    Family History  Problem Relation Age of Onset  . Hypertension Father   . Arthritis Maternal Grandmother   . Diabetes Maternal Grandmother   . Cancer Paternal Grandfather     throat  . Stroke Neg Hx     Social History   Social History  . Marital status: Unknown    Spouse name: N/A  . Number of children: N/A  . Years of education: N/A   Occupational History  . Not on file.   Social History Main Topics  . Smoking status: Never Smoker  . Smokeless tobacco: Never Used  . Alcohol use 0.6 oz/week    1 Cans of beer per week     Comment: occasional  . Drug use: No  . Sexual activity: Yes   Other Topics Concern  . Not on file   Social History Narrative  . No narrative on file     Constitutional: Denies fever, malaise, fatigue, headache or abrupt weight changes.  Respiratory: Denies difficulty breathing, shortness of breath, cough or sputum production.   Cardiovascular: Denies  chest pain, chest tightness, palpitations or swelling in the hands or feet.  Neurological: Denies dizziness, difficulty with memory, difficulty with speech or problems with balance and coordination.    No other specific complaints in a complete review of systems (except as listed in HPI above).  Objective:   Physical Exam  BP 124/78   Pulse 74   Temp 98.4 F (36.9 C) (Oral)   Wt 215 lb 4 oz (97.6 kg)   SpO2 98%   BMI 28.79 kg/m  Wt Readings from Last 3 Encounters:  03/07/17 215 lb 4 oz (97.6 kg)  01/07/17 221 lb 4 oz (100.4 kg)  11/30/16 217 lb 12 oz (98.8 kg)    General: Appears his stated age, in NAD. Cardiovascular: Normal rate and rhythm. S1,S2 noted.  No murmur, rubs or gallops noted.  Pulmonary/Chest: Normal effort and positive vesicular breath sounds. No respiratory distress. No wheezes, rales or ronchi noted.  Neurological: Alert and oriented.   BMET    Component Value Date/Time   NA 133 (L) 11/30/2016 0846   K 4.5 11/30/2016 0846   CL 96 11/30/2016 0846   CO2 31 11/30/2016 0846   GLUCOSE 89 11/30/2016 0846   BUN 13 11/30/2016 0846   CREATININE 1.01  11/30/2016 0846   CALCIUM 9.9 11/30/2016 0846    Lipid Panel     Component Value Date/Time   CHOL 172 11/30/2016 0846   TRIG 109.0 11/30/2016 0846   HDL 45.90 11/30/2016 0846   CHOLHDL 4 11/30/2016 0846   VLDL 21.8 11/30/2016 0846   LDLCALC 105 (H) 11/30/2016 0846    CBC    Component Value Date/Time   WBC 6.0 11/30/2016 0846   RBC 4.82 11/30/2016 0846   HGB 15.1 11/30/2016 0846   HCT 43.6 11/30/2016 0846   PLT 303.0 11/30/2016 0846   MCV 90.4 11/30/2016 0846   MCHC 34.6 11/30/2016 0846   RDW 13.1 11/30/2016 0846    Hgb A1C Lab Results  Component Value Date   HGBA1C 5.4 11/30/2016            Assessment & Plan:   Elevated Blood Pressure:  BP at goal off meds Encouraged him to continue DASH diet Monitor at this time  RTC in 9 months for your annual exam Nicki Reaper, NP

## 2017-09-01 ENCOUNTER — Ambulatory Visit (INDEPENDENT_AMBULATORY_CARE_PROVIDER_SITE_OTHER): Payer: Managed Care, Other (non HMO) | Admitting: Internal Medicine

## 2017-09-01 ENCOUNTER — Encounter: Payer: Self-pay | Admitting: Internal Medicine

## 2017-09-01 VITALS — BP 130/82 | HR 73 | Temp 98.6°F | Wt 223.0 lb

## 2017-09-01 DIAGNOSIS — J069 Acute upper respiratory infection, unspecified: Secondary | ICD-10-CM | POA: Diagnosis not present

## 2017-09-01 MED ORDER — DOXYCYCLINE HYCLATE 100 MG PO TABS: 100.0000 mg | ORAL_TABLET | Freq: Two times a day (BID) | ORAL | 0 refills | Status: DC

## 2017-09-01 NOTE — Patient Instructions (Signed)
Upper Respiratory Infection, Adult Most upper respiratory infections (URIs) are caused by a virus. A URI affects the nose, throat, and upper air passages. The most common type of URI is often called "the common cold." Follow these instructions at home:  Take medicines only as told by your doctor.  Gargle warm saltwater or take cough drops to comfort your throat as told by your doctor.  Use a warm mist humidifier or inhale steam from a shower to increase air moisture. This may make it easier to breathe.  Drink enough fluid to keep your pee (urine) clear or pale yellow.  Eat soups and other clear broths.  Have a healthy diet.  Rest as needed.  Go back to work when your fever is gone or your doctor says it is okay. ? You may need to stay home longer to avoid giving your URI to others. ? You can also wear a face mask and wash your hands often to prevent spread of the virus.  Use your inhaler more if you have asthma.  Do not use any tobacco products, including cigarettes, chewing tobacco, or electronic cigarettes. If you need help quitting, ask your doctor. Contact a doctor if:  You are getting worse, not better.  Your symptoms are not helped by medicine.  You have chills.  You are getting more short of breath.  You have brown or red mucus.  You have yellow or brown discharge from your nose.  You have pain in your face, especially when you bend forward.  You have a fever.  You have puffy (swollen) neck glands.  You have pain while swallowing.  You have white areas in the back of your throat. Get help right away if:  You have very bad or constant: ? Headache. ? Ear pain. ? Pain in your forehead, behind your eyes, and over your cheekbones (sinus pain). ? Chest pain.  You have long-lasting (chronic) lung disease and any of the following: ? Wheezing. ? Long-lasting cough. ? Coughing up blood. ? A change in your usual mucus.  You have a stiff neck.  You have  changes in your: ? Vision. ? Hearing. ? Thinking. ? Mood. This information is not intended to replace advice given to you by your health care provider. Make sure you discuss any questions you have with your health care provider. Document Released: 04/19/2008 Document Revised: 07/04/2016 Document Reviewed: 02/06/2014 Elsevier Interactive Patient Education  2018 Elsevier Inc.  

## 2017-09-01 NOTE — Progress Notes (Signed)
HPI  Pt presents to the clinic today with c/o nasal congestion, ear fullness, sore throat and cough. He reports this started 1-2 weeks ago. He is blowing clear mucous out of his nose. He denies ear pain or decreased hearing. He is having some difficulty swallowing. The cough is productive of green mucous. He denies fever, chills or body aches, but has been fatigued. He has tried Zyrtec, Flonase, Mucinex and Catering manager with some relief. He has a history of allergies but he has not had sick contacts that he is aware of.  Review of Systems        Past Medical History:  Diagnosis Date  . Allergy   . GERD (gastroesophageal reflux disease)   . Hypertension     Family History  Problem Relation Age of Onset  . Hypertension Father   . Arthritis Maternal Grandmother   . Diabetes Maternal Grandmother   . Cancer Paternal Grandfather        throat  . Stroke Neg Hx     Social History   Social History  . Marital status: Unknown    Spouse name: N/A  . Number of children: N/A  . Years of education: N/A   Occupational History  . Not on file.   Social History Main Topics  . Smoking status: Never Smoker  . Smokeless tobacco: Never Used  . Alcohol use 0.6 oz/week    1 Cans of beer per week     Comment: occasional  . Drug use: No  . Sexual activity: Yes   Other Topics Concern  . Not on file   Social History Narrative  . No narrative on file    Allergies  Allergen Reactions  . Cephalosporins   . Sulfa Antibiotics Hives     Constitutional: Positive fatigue. Denies headache, fever or abrupt weight changes.  HEENT:  Positive ear fullness, nasal congestion, sore throat. Denies eye redness, eye pain, pressure behind the eyes, facial pain, ear pain, ringing in the ears, wax buildup, runny nose or bloody nose. Respiratory: Positive cough. Denies difficulty breathing or shortness of breath.  Cardiovascular: Denies chest pain, chest tightness, palpitations or swelling in the hands or  feet.   No other specific complaints in a complete review of systems (except as listed in HPI above).  Objective:   BP 130/82   Pulse 73   Temp 98.6 F (37 C) (Oral)   Wt 223 lb (101.2 kg)   SpO2 98%   BMI 29.83 kg/m  Wt Readings from Last 3 Encounters:  09/01/17 223 lb (101.2 kg)  03/07/17 215 lb 4 oz (97.6 kg)  01/07/17 221 lb 4 oz (100.4 kg)     General: Appears his stated age, ill appearing, in NAD. HEENT: Head: normal shape and size, no sinus tenderness noted; Ears: Tm's gray and intact, normal light reflex; Nose: mucosa pink and moist, septum midline; Throat/Mouth: + PND. Teeth present, mucosa pink and moist, no exudate noted, no lesions or ulcerations noted.  Neck: Bilateral anterior cervical lymphadenopathy.  Pulmonary/Chest: Normal effort and positive vesicular breath sounds. No respiratory distress. No wheezes, rales or ronchi noted.       Assessment & Plan:   Upper Respiratory Infection:  Get some rest and drink plenty of water Do salt water gargles for the sore throat Continue Zyrtec and Flonase eRx for Doxycycline 100 mg BID x 7 days Use OTC cough syrup  RTC as needed or if symptoms persist.   Nicki Reaper, NP

## 2017-09-07 ENCOUNTER — Telehealth: Payer: Self-pay | Admitting: Internal Medicine

## 2017-09-07 NOTE — Telephone Encounter (Signed)
If abx did not take care of it, it is likely viral. Is he doing any Zyrtec and Flonase?

## 2017-09-07 NOTE — Telephone Encounter (Signed)
Copied from CRM #[id number]. Topic: Quick Communication - See Telephone Encounter >> Sep 07, 2017 11:24 AM Landry MellowFoltz, Melissa J wrote: CRM for notification. See Telephone encounter for:  09/07/17. The abx that pt is taking for infection is almost finished, but pt still has all symptoms. Cough, sore throat, post nasal drip.  He is wanting to know if he needs another round of abx.  cb number is [phone]. Pharmacy is [facility]

## 2017-09-09 ENCOUNTER — Ambulatory Visit (INDEPENDENT_AMBULATORY_CARE_PROVIDER_SITE_OTHER): Payer: Managed Care, Other (non HMO) | Admitting: Family Medicine

## 2017-09-09 ENCOUNTER — Encounter: Payer: Self-pay | Admitting: Family Medicine

## 2017-09-09 DIAGNOSIS — R05 Cough: Secondary | ICD-10-CM

## 2017-09-09 DIAGNOSIS — R059 Cough, unspecified: Secondary | ICD-10-CM

## 2017-09-09 MED ORDER — ALBUTEROL SULFATE HFA 108 (90 BASE) MCG/ACT IN AERS: 2.0000 | INHALATION_SPRAY | Freq: Four times a day (QID) | RESPIRATORY_TRACT | 1 refills | Status: DC | PRN

## 2017-09-09 MED ORDER — DOXYCYCLINE HYCLATE 100 MG PO TABS
100.0000 mg | ORAL_TABLET | Freq: Two times a day (BID) | ORAL | 0 refills | Status: DC
Start: 2017-09-09 — End: 2017-12-01

## 2017-09-09 NOTE — Telephone Encounter (Signed)
Pt has appt to see Dr Para Marchuncan 09/09/17 at 4:15.

## 2017-09-09 NOTE — Patient Instructions (Signed)
Use albuterol if needed for the cough.  Restart doxy.  Update [place] as needed.  Rest and fluids.  Take care.  Glad to see you.

## 2017-09-09 NOTE — Progress Notes (Signed)
Sick for about 2-3 weeks so far. Seen last week, started on doxy.  Done with abx.  Still with productive cough, post nasal gtt.  ST is resolved.  Getting SOB with exertion.  No vomiting, no diarrhea.  Sore in the L lateral chest wall, unclear if from the cough.  Not sore to the touch.  No rash.  Some occ wheeze.  No recent inhaler use, not in years.  Today he is much less SOB than last week.  Prev was lightheaded, not now.  Still fatigued.  No noted fevers.    H/o PNA in the past as a teenager and then again later in life.   Meds, vitals, and allergies reviewed.   ROS: Per HPI unless specifically indicated in ROS section   GEN: nad, alert and oriented HEENT: mucous membranes moist, tm w/o erythema, nasal exam w/o erythema, clear discharge noted,  OP with cobblestoning NECK: supple w/o LA CV: rrr.   PULM: ctab, no inc wob, no focal dec in BS  EXT: no edema

## 2017-09-09 NOTE — Telephone Encounter (Signed)
He says he is still in pain and coughing a lot and winded. Coming in to see Dr. Para Marchuncan

## 2017-09-11 DIAGNOSIS — R059 Cough, unspecified: Secondary | ICD-10-CM | POA: Insufficient documentation

## 2017-09-11 DIAGNOSIS — R05 Cough: Secondary | ICD-10-CM | POA: Insufficient documentation

## 2017-09-11 NOTE — Assessment & Plan Note (Signed)
Clearly improved but not resolved. Okay for outpatient follow-up. Lungs are clear. Speaking in complete sentences. Discussed with patient about options.Use albuterol if needed for the cough.  Restart doxy.  Update [place] as needed.  Rest and fluids.  He agrees.

## 2017-12-01 ENCOUNTER — Encounter: Payer: Self-pay | Admitting: Internal Medicine

## 2017-12-01 ENCOUNTER — Ambulatory Visit (INDEPENDENT_AMBULATORY_CARE_PROVIDER_SITE_OTHER): Payer: Managed Care, Other (non HMO) | Admitting: Internal Medicine

## 2017-12-01 VITALS — BP 122/70 | HR 75 | Temp 98.6°F | Ht 72.25 in | Wt 211.8 lb

## 2017-12-01 DIAGNOSIS — K219 Gastro-esophageal reflux disease without esophagitis: Secondary | ICD-10-CM

## 2017-12-01 DIAGNOSIS — R195 Other fecal abnormalities: Secondary | ICD-10-CM | POA: Diagnosis not present

## 2017-12-01 DIAGNOSIS — Z0001 Encounter for general adult medical examination with abnormal findings: Secondary | ICD-10-CM | POA: Diagnosis not present

## 2017-12-01 DIAGNOSIS — R1032 Left lower quadrant pain: Secondary | ICD-10-CM | POA: Diagnosis not present

## 2017-12-01 DIAGNOSIS — Z23 Encounter for immunization: Secondary | ICD-10-CM

## 2017-12-01 DIAGNOSIS — I10 Essential (primary) hypertension: Secondary | ICD-10-CM | POA: Diagnosis not present

## 2017-12-01 DIAGNOSIS — M545 Low back pain, unspecified: Secondary | ICD-10-CM

## 2017-12-01 DIAGNOSIS — Z13220 Encounter for screening for lipoid disorders: Secondary | ICD-10-CM

## 2017-12-01 LAB — CBC
HEMATOCRIT: 44.9 % (ref 39.0–52.0)
HEMOGLOBIN: 15.2 g/dL (ref 13.0–17.0)
MCHC: 33.9 g/dL (ref 30.0–36.0)
MCV: 93.6 fl (ref 78.0–100.0)
Platelets: 299 10*3/uL (ref 150.0–400.0)
RBC: 4.8 Mil/uL (ref 4.22–5.81)
RDW: 13.3 % (ref 11.5–15.5)
WBC: 4.3 10*3/uL (ref 4.0–10.5)

## 2017-12-01 LAB — LIPID PANEL
CHOL/HDL RATIO: 3
Cholesterol: 143 mg/dL (ref 0–200)
HDL: 47 mg/dL (ref >39.00)
LDL Cholesterol: 79 mg/dL (ref 0–99)
NONHDL: 96.12
Triglycerides: 88 mg/dL (ref 0.0–149.0)
VLDL: 17.6 mg/dL (ref 0.0–40.0)

## 2017-12-01 LAB — COMPREHENSIVE METABOLIC PANEL
ALT: 18 U/L (ref 0–53)
AST: 19 U/L (ref 0–37)
Albumin: 4.7 g/dL (ref 3.5–5.2)
Alkaline Phosphatase: 65 U/L (ref 39–117)
BUN: 10 mg/dL (ref 6–23)
CO2: 31 meq/L (ref 19–32)
Calcium: 9.7 mg/dL (ref 8.4–10.5)
Chloride: 102 mEq/L (ref 96–112)
Creatinine, Ser: 0.97 mg/dL (ref 0.40–1.50)
GFR: 98.32 mL/min (ref >60.00)
GLUCOSE: 93 mg/dL (ref 70–99)
POTASSIUM: 4.3 meq/L (ref 3.5–5.1)
Sodium: 138 mEq/L (ref 135–145)
Total Bilirubin: 0.6 mg/dL (ref 0.2–1.2)
Total Protein: 7.2 g/dL (ref 6.0–8.3)

## 2017-12-01 NOTE — Assessment & Plan Note (Signed)
Discussed foods to avoid with GERD Advised him Nexium is made to be taken regularly not as needed He would be better off to just take TUMs prn, if > 3 days a week, will need to restart Nexium

## 2017-12-01 NOTE — Progress Notes (Signed)
Subjective:    Patient ID: Cameron Walker, male    DOB: 16-Apr-1991, 27 y.o.   MRN: [id number]  HPI  Pt presents to the clinic today for his annual exam. He is also due to follow up chronic conditions.  HTN: His BP today is 122/70. He is not currently taking any antihypertensive medication OTC.  GERD: He reports this occurs about 1 x week. He will take his Nexium as needed for reflux. He occasionally also takes TUMS with good relief. He reports he stopped taking the Nexium daily about 1 month ago because of "stomach issues".  He is also concerned about abdominal pain. This started 2 months ago. He describes it as being able to "feel the food moving through his intestines". He occasionally has loose stools but denies constipation or blood in his stool. He denies nausea, vomiting or abdominal cramping. He is concerned that he has IBS, because his mother and sister also have it. He has been consuming a FODMAP diet over the last 2 weeks with some improvement. He would like a referral to GI for further evaluation, but reports his insurance is about to run out because he is changing jobs.  He also reports low back pain. This started 2 months ago. The pain is located on both sides of his lower back. He describes the pain as sore and tight. The pain does not radiate. He denies any injury to the area, but does a lot of bending over at work. He has not tried any medications OTC for his symptoms.   Flu: 11/2016 Tetanus: 11/2016 Dentist: biannually  Diet: He does eat meat. He consumes fruits and veggies daily. He occassionally eats fried foods. He drinks mostly water. Exercise: None  Review of Systems      Past Medical History:  Diagnosis Date  . Allergy   . GERD (gastroesophageal reflux disease)   . Hypertension     Current Outpatient Medications  Medication Sig Dispense Refill  . albuterol (PROVENTIL HFA;VENTOLIN HFA) 108 (90 Base) MCG/ACT inhaler Inhale 2 puffs into the lungs every 6 (six)  hours as needed for wheezing or shortness of breath (or for cough). (Patient not taking: Reported on 12/01/2017) 1 Inhaler 1  . Cetirizine HCl (ZYRTEC ALLERGY) 10 MG CAPS Take by mouth.    . esomeprazole (NEXIUM) 20 MG capsule Take 20 mg by mouth daily at 12 noon.    . fluticasone (FLONASE) 50 MCG/ACT nasal spray Place 2 sprays into both nostrils daily.     No current facility-administered medications for this visit.     Allergies  Allergen Reactions  . Cephalosporins   . Sulfa Antibiotics Hives    Family History  Problem Relation Age of Onset  . Hypertension Father   . Arthritis Maternal Grandmother   . Diabetes Maternal Grandmother   . Cancer Paternal Grandfather        throat  . Stroke Neg Hx     Social History   Socioeconomic History  . Marital status: Unknown    Spouse name: Not on file  . Number of children: Not on file  . Years of education: Not on file  . Highest education level: Not on file  Social Needs  . Financial resource strain: Not on file  . Food insecurity - worry: Not on file  . Food insecurity - inability: Not on file  . Transportation needs - medical: Not on file  . Transportation needs - non-medical: Not on file  Occupational History  .  Not on file  Tobacco Use  . Smoking status: Never Smoker  . Smokeless tobacco: Never Used  Substance and Sexual Activity  . Alcohol use: Yes    Alcohol/week: 0.6 oz    Types: 1 Cans of beer per week    Comment: occasional  . Drug use: No  . Sexual activity: Yes  Other Topics Concern  . Not on file  Social History Narrative  . Not on file     Constitutional: Denies fever, malaise, fatigue, headache or abrupt weight changes.  HEENT: Denies eye pain, eye redness, ear pain, ringing in the ears, wax buildup, runny nose, nasal congestion, bloody nose, or sore throat. Respiratory: Denies difficulty breathing, shortness of breath, cough or sputum production.   Cardiovascular: Denies chest pain, chest tightness,  palpitations or swelling in the hands or feet.  Gastrointestinal: Pt reports abdominal pain, loose stool. Denies bloating, constipation, diarrhea or blood in the stool.  GU: Denies urgency, frequency, pain with urination, burning sensation, blood in urine, odor or discharge. Musculoskeletal: Pt reports intermittent low back pain. Denies decrease in range of motion, difficulty with gait, or joint pain and swelling.  Skin: Denies redness, rashes, lesions or ulcercations.  Neurological: Denies dizziness, difficulty with memory, difficulty with speech or problems with balance and coordination.  Psych: Denies anxiety, depression, SI/HI.  No other specific complaints in a complete review of systems (except as listed in HPI above).  Objective:   Physical Exam   BP 122/70   Pulse 75   Temp 98.6 F (37 C) (Oral)   Ht 6' 0.25" (1.835 m)   Wt 211 lb 12 oz (96 kg)   SpO2 96%   BMI 28.52 kg/m  Wt Readings from Last 3 Encounters:  12/01/17 211 lb 12 oz (96 kg)  09/09/17 228 lb 8 oz (103.6 kg)  09/01/17 223 lb (101.2 kg)    General: Appears his stated age, well developed, well nourished in NAD. Skin: Warm, dry and intact.  HEENT: Head: normal shape and size; Eyes: sclera white, no icterus, conjunctiva pink, PERRLA and EOMs intact; Ears: Tm's gray and intact, normal light reflex; Throat/Mouth: Teeth present, mucosa pink and moist, no exudate, lesions or ulcerations noted.  Neck:  Neck supple, trachea midline. No masses, lumps or thyromegaly present.  Cardiovascular: Normal rate and rhythm. S1,S2 noted.  No murmur, rubs or gallops noted. No JVD or BLE edema. Pulmonary/Chest: Normal effort and positive vesicular breath sounds. No respiratory distress. No wheezes, rales or ronchi noted.  Abdomen: Soft and mildly tender in the LLQ. Normal bowel sounds. No distention or masses noted. Liver, spleen and kidneys non palpable. Musculoskeletal: Normal flexion, extension and rotation of the spine. No bony  tenderness noted over the spine. Pain with palpation of the paralumbar muscles, R>L. Strength 5/5 BUE/BLE.  No difficulty with gait.  Neurological: Alert and oriented. Cranial nerves II-XII grossly intact. Coordination normal.  Psychiatric: Mood and affect normal. Behavior is normal. Judgment and thought content normal.     BMET    Component Value Date/Time   NA 133 (L) 11/30/2016 0846   K 4.5 11/30/2016 0846   CL 96 11/30/2016 0846   CO2 31 11/30/2016 0846   GLUCOSE 89 11/30/2016 0846   BUN 13 11/30/2016 0846   CREATININE 1.01 11/30/2016 0846   CALCIUM 9.9 11/30/2016 0846    Lipid Panel     Component Value Date/Time   CHOL 172 11/30/2016 0846   TRIG 109.0 11/30/2016 0846   HDL 45.90 11/30/2016 0846  CHOLHDL 4 11/30/2016 0846   VLDL 21.8 11/30/2016 0846   LDLCALC 105 (H) 11/30/2016 0846    CBC    Component Value Date/Time   WBC 6.0 11/30/2016 0846   RBC 4.82 11/30/2016 0846   HGB 15.1 11/30/2016 0846   HCT 43.6 11/30/2016 0846   PLT 303.0 11/30/2016 0846   MCV 90.4 11/30/2016 0846   MCHC 34.6 11/30/2016 0846   RDW 13.1 11/30/2016 0846    Hgb A1C Lab Results  Component Value Date   HGBA1C 5.4 11/30/2016           Assessment & Plan:   Preventative Health Maintenance:  Flu shot today Tetanus UTD Encouraged him to consume a balanced diet and exercise regimen Advised him to see a dentist annually Will check CBC, CMET and Lipid profile today He declines STD screening  LLQ Abdominal Pain, Loose Stool:  Likely IBS, given symptoms and family history Continue FODMAP diet as this seems to be helping He will call back when he is ready for referral to GI  Low Back Pain:  Muscular Encouraged daily stretching- exercises given Heat and massage may be helpful Can take Ibuprofen as needed for pain control  RTC in 1 year, sooner if needed Nicki Reaper, NP

## 2017-12-01 NOTE — Assessment & Plan Note (Signed)
Controlled off meds CBC and CMET today Will monitor 

## 2017-12-01 NOTE — Patient Instructions (Signed)

## 2018-05-08 DIAGNOSIS — M546 Pain in thoracic spine: Secondary | ICD-10-CM | POA: Diagnosis not present

## 2018-05-08 DIAGNOSIS — M25512 Pain in left shoulder: Secondary | ICD-10-CM | POA: Diagnosis not present

## 2018-05-08 DIAGNOSIS — M542 Cervicalgia: Secondary | ICD-10-CM | POA: Diagnosis not present

## 2018-05-10 DIAGNOSIS — M542 Cervicalgia: Secondary | ICD-10-CM | POA: Diagnosis not present

## 2018-05-10 DIAGNOSIS — M546 Pain in thoracic spine: Secondary | ICD-10-CM | POA: Diagnosis not present

## 2018-05-10 DIAGNOSIS — M25512 Pain in left shoulder: Secondary | ICD-10-CM | POA: Diagnosis not present

## 2018-05-25 DIAGNOSIS — M546 Pain in thoracic spine: Secondary | ICD-10-CM | POA: Diagnosis not present

## 2018-05-25 DIAGNOSIS — M542 Cervicalgia: Secondary | ICD-10-CM | POA: Diagnosis not present

## 2018-05-25 DIAGNOSIS — M25512 Pain in left shoulder: Secondary | ICD-10-CM | POA: Diagnosis not present

## 2018-05-30 DIAGNOSIS — M542 Cervicalgia: Secondary | ICD-10-CM | POA: Diagnosis not present

## 2018-05-30 DIAGNOSIS — M25512 Pain in left shoulder: Secondary | ICD-10-CM | POA: Diagnosis not present

## 2018-05-30 DIAGNOSIS — M546 Pain in thoracic spine: Secondary | ICD-10-CM | POA: Diagnosis not present

## 2018-06-06 DIAGNOSIS — M542 Cervicalgia: Secondary | ICD-10-CM | POA: Diagnosis not present

## 2018-06-06 DIAGNOSIS — M546 Pain in thoracic spine: Secondary | ICD-10-CM | POA: Diagnosis not present

## 2018-06-06 DIAGNOSIS — M25512 Pain in left shoulder: Secondary | ICD-10-CM | POA: Diagnosis not present

## 2018-06-13 DIAGNOSIS — M546 Pain in thoracic spine: Secondary | ICD-10-CM | POA: Diagnosis not present

## 2018-06-13 DIAGNOSIS — M542 Cervicalgia: Secondary | ICD-10-CM | POA: Diagnosis not present

## 2018-06-13 DIAGNOSIS — M25512 Pain in left shoulder: Secondary | ICD-10-CM | POA: Diagnosis not present

## 2018-07-04 DIAGNOSIS — M25512 Pain in left shoulder: Secondary | ICD-10-CM | POA: Diagnosis not present

## 2018-07-04 DIAGNOSIS — M542 Cervicalgia: Secondary | ICD-10-CM | POA: Diagnosis not present

## 2018-07-04 DIAGNOSIS — M546 Pain in thoracic spine: Secondary | ICD-10-CM | POA: Diagnosis not present

## 2018-07-11 DIAGNOSIS — R195 Other fecal abnormalities: Secondary | ICD-10-CM | POA: Diagnosis not present

## 2018-07-11 DIAGNOSIS — K625 Hemorrhage of anus and rectum: Secondary | ICD-10-CM | POA: Diagnosis not present

## 2018-07-11 DIAGNOSIS — R194 Change in bowel habit: Secondary | ICD-10-CM | POA: Diagnosis not present

## 2018-07-13 DIAGNOSIS — M542 Cervicalgia: Secondary | ICD-10-CM | POA: Diagnosis not present

## 2018-07-13 DIAGNOSIS — M25512 Pain in left shoulder: Secondary | ICD-10-CM | POA: Diagnosis not present

## 2018-07-13 DIAGNOSIS — M546 Pain in thoracic spine: Secondary | ICD-10-CM | POA: Diagnosis not present

## 2018-07-27 DIAGNOSIS — M25512 Pain in left shoulder: Secondary | ICD-10-CM | POA: Diagnosis not present

## 2018-07-27 DIAGNOSIS — M542 Cervicalgia: Secondary | ICD-10-CM | POA: Diagnosis not present

## 2018-07-27 DIAGNOSIS — M546 Pain in thoracic spine: Secondary | ICD-10-CM | POA: Diagnosis not present

## 2018-08-04 DIAGNOSIS — K5289 Other specified noninfective gastroenteritis and colitis: Secondary | ICD-10-CM | POA: Diagnosis not present

## 2018-08-04 DIAGNOSIS — R197 Diarrhea, unspecified: Secondary | ICD-10-CM | POA: Diagnosis not present

## 2018-08-04 DIAGNOSIS — K635 Polyp of colon: Secondary | ICD-10-CM | POA: Diagnosis not present

## 2018-08-18 ENCOUNTER — Other Ambulatory Visit: Payer: Self-pay | Admitting: Gastroenterology

## 2018-08-18 DIAGNOSIS — K529 Noninfective gastroenteritis and colitis, unspecified: Secondary | ICD-10-CM

## 2018-08-21 ENCOUNTER — Ambulatory Visit (INDEPENDENT_AMBULATORY_CARE_PROVIDER_SITE_OTHER): Payer: 59 | Admitting: Internal Medicine

## 2018-08-21 ENCOUNTER — Encounter: Payer: Self-pay | Admitting: Internal Medicine

## 2018-08-21 VITALS — BP 136/80 | HR 74 | Temp 98.5°F | Wt 209.0 lb

## 2018-08-21 DIAGNOSIS — J00 Acute nasopharyngitis [common cold]: Secondary | ICD-10-CM | POA: Diagnosis not present

## 2018-08-21 DIAGNOSIS — J302 Other seasonal allergic rhinitis: Secondary | ICD-10-CM | POA: Diagnosis not present

## 2018-08-21 MED ORDER — AZITHROMYCIN 250 MG PO TABS: ORAL_TABLET | ORAL | 0 refills | Status: DC

## 2018-08-21 NOTE — Progress Notes (Signed)
HPI  Pt presents to the clinic today with c/o runny nose, nasal congestion, sore throat, post nasal drip and cough. He reports this started. He is blowing light green mucous out of his nose, worse in the morning. The cough is non productive. He denies fever, chills or body aches. He has tried Flonase, Zyrtec, Sudafed and Mucinex with minimal relief. He does have a history of seasonal allergies.  He has not had sick contacts.  Review of Systems      Past Medical History:  Diagnosis Date  . Allergy   . GERD (gastroesophageal reflux disease)   . Hypertension     Family History  Problem Relation Age of Onset  . Hypertension Father   . Arthritis Maternal Grandmother   . Diabetes Maternal Grandmother   . Cancer Paternal Grandfather        throat  . Stroke Neg Hx     Social History   Socioeconomic History  . Marital status: Unknown    Spouse name: Not on file  . Number of children: Not on file  . Years of education: Not on file  . Highest education level: Not on file  Occupational History  . Not on file  Social Needs  . Financial resource strain: Not on file  . Food insecurity:    Worry: Not on file    Inability: Not on file  . Transportation needs:    Medical: Not on file    Non-medical: Not on file  Tobacco Use  . Smoking status: Never Smoker  . Smokeless tobacco: Never Used  Substance and Sexual Activity  . Alcohol use: Yes    Alcohol/week: 1.0 standard drinks    Types: 1 Cans of beer per week    Comment: occasional  . Drug use: No  . Sexual activity: Yes  Lifestyle  . Physical activity:    Days per week: Not on file    Minutes per session: Not on file  . Stress: Not on file  Relationships  . Social connections:    Talks on phone: Not on file    Gets together: Not on file    Attends religious service: Not on file    Active member of club or organization: Not on file    Attends meetings of clubs or organizations: Not on file    Relationship status: Not on  file  . Intimate partner violence:    Fear of current or ex partner: Not on file    Emotionally abused: Not on file    Physically abused: Not on file    Forced sexual activity: Not on file  Other Topics Concern  . Not on file  Social History Narrative  . Not on file    Allergies  Allergen Reactions  . Cephalosporins   . Sulfa Antibiotics Hives     Constitutional: Denies headache, fatigue, fever or abrupt weight changes.  HEENT:  Positive runny nose, nasal congestion, sore throat. Denies eye redness, eye pain, pressure behind the eyes, facial pain, nasal congestion, ear pain, ringing in the ears, wax buildup, runny nose or bloody nose. Respiratory: Positive cough. Denies difficulty breathing or shortness of breath.  Cardiovascular: Denies chest pain, chest tightness, palpitations or swelling in the hands or feet.   No other specific complaints in a complete review of systems (except as listed in HPI above).  Objective:  BP 136/80   Pulse 74   Temp 98.5 F (36.9 C) (Oral)   Wt 209 lb (94.8 kg)  SpO2 97%   BMI 28.15 kg/m   Wt Readings from Last 3 Encounters:  12/01/17 211 lb 12 oz (96 kg)  09/09/17 228 lb 8 oz (103.6 kg)  09/01/17 223 lb (101.2 kg)     General: Appears his stated age, ill appearing, in NAD. HEENT: Head: normal shape and size, mild maxillary sinus tenderness noted;  Ears: Tm's gray and intact, normal light reflex; Nose: mucosa pink and moist, septum midline; Throat/Mouth: + PND. Teeth present, mucosa erythematous and moist, no exudate noted, no lesions or ulcerations noted.  Neck: No cervical lymphadenopathy.  Cardiovascular: Normal rate and rhythm. S1,S2 noted.  No murmur, rubs or gallops noted.  Pulmonary/Chest: Normal effort and positive vesicular breath sounds. No respiratory distress. No wheezes, rales or ronchi noted.       Assessment & Plan:   Upper Respiratory Infection, complicated by Allergic Rhinitis:  Get some rest and drink plenty of  water Do salt water gargles for the sore throat Continue Zyrtec and Flonase He declines Depo Medrol IM today eRx for Azithromax x 5 days   RTC as needed or if symptoms persist.   Nicki Reaper, NP

## 2018-08-21 NOTE — Patient Instructions (Signed)
Upper Respiratory Infection, Adult Most upper respiratory infections (URIs) are caused by a virus. A URI affects the nose, throat, and upper air passages. The most common type of URI is often called "the common cold." Follow these instructions at home:  Take medicines only as told by your doctor.  Gargle warm saltwater or take cough drops to comfort your throat as told by your doctor.  Use a warm mist humidifier or inhale steam from a shower to increase air moisture. This may make it easier to breathe.  Drink enough fluid to keep your pee (urine) clear or pale yellow.  Eat soups and other clear broths.  Have a healthy diet.  Rest as needed.  Go back to work when your fever is gone or your doctor says it is okay. ? You may need to stay home longer to avoid giving your URI to others. ? You can also wear a face mask and wash your hands often to prevent spread of the virus.  Use your inhaler more if you have asthma.  Do not use any tobacco products, including cigarettes, chewing tobacco, or electronic cigarettes. If you need help quitting, ask your doctor. Contact a doctor if:  You are getting worse, not better.  Your symptoms are not helped by medicine.  You have chills.  You are getting more short of breath.  You have brown or red mucus.  You have yellow or brown discharge from your nose.  You have pain in your face, especially when you bend forward.  You have a fever.  You have puffy (swollen) neck glands.  You have pain while swallowing.  You have white areas in the back of your throat. Get help right away if:  You have very bad or constant: ? Headache. ? Ear pain. ? Pain in your forehead, behind your eyes, and over your cheekbones (sinus pain). ? Chest pain.  You have long-lasting (chronic) lung disease and any of the following: ? Wheezing. ? Long-lasting cough. ? Coughing up blood. ? A change in your usual mucus.  You have a stiff neck.  You have  changes in your: ? Vision. ? Hearing. ? Thinking. ? Mood. This information is not intended to replace advice given to you by your health care provider. Make sure you discuss any questions you have with your health care provider. Document Released: 04/19/2008 Document Revised: 07/04/2016 Document Reviewed: 02/06/2014 Elsevier Interactive Patient Education  2018 Elsevier Inc.  

## 2018-08-25 DIAGNOSIS — Z23 Encounter for immunization: Secondary | ICD-10-CM | POA: Diagnosis not present

## 2018-08-29 ENCOUNTER — Telehealth: Payer: Self-pay | Admitting: Internal Medicine

## 2018-08-29 DIAGNOSIS — J3489 Other specified disorders of nose and nasal sinuses: Secondary | ICD-10-CM

## 2018-08-29 MED ORDER — PREDNISONE 20 MG PO TABS: ORAL_TABLET | ORAL | 0 refills | Status: DC

## 2018-08-29 NOTE — Telephone Encounter (Signed)
Copied from CRM [id number]. Topic: Quick Communication - Rx Refill/Question >> Aug 29, 2018  7:56 AM Cameron Walker wrote: Medication: additional medication for sinus issue Pt saw Cameron Walker last week for sinus issue and sore throat. Pt states the sore throat is better, but the sinus issue has gotten worse.  Pt states he has congestion, runny nose and post nasal drip. Would like something else to help clear up his head.   [phone], [place] - [phone] [phone] (Phone) [phone] (Fax)

## 2018-08-29 NOTE — Telephone Encounter (Signed)
Pt is aware as instructed 

## 2018-08-29 NOTE — Telephone Encounter (Signed)
Noted. We can trial a round of low dose prednisone for pressure and congestion.  I sent a prescription for prednisone to his pharmacy. Take 2 tablets daily for 3 days, then 1 tablet daily for 3 days.

## 2018-08-31 ENCOUNTER — Other Ambulatory Visit: Payer: Self-pay

## 2018-08-31 ENCOUNTER — Ambulatory Visit
Admission: RE | Admit: 2018-08-31 | Discharge: 2018-08-31 | Disposition: A | Discharge status: Home / Self Care | Payer: 59 | Source: Ambulatory Visit | Attending: Gastroenterology | Admitting: Gastroenterology

## 2018-08-31 DIAGNOSIS — R109 Unspecified abdominal pain: Secondary | ICD-10-CM | POA: Diagnosis not present

## 2018-08-31 DIAGNOSIS — K529 Noninfective gastroenteritis and colitis, unspecified: Secondary | ICD-10-CM

## 2018-08-31 MED ORDER — IOPAMIDOL (ISOVUE-300) INJECTION 61%
100.0000 mL | Freq: Once | INTRAVENOUS | Status: AC | PRN
  Administered 2018-08-31: 100 mL via INTRAVENOUS

## 2018-09-05 DIAGNOSIS — K5000 Crohn's disease of small intestine without complications: Secondary | ICD-10-CM | POA: Diagnosis not present

## 2018-09-12 DIAGNOSIS — M25512 Pain in left shoulder: Secondary | ICD-10-CM | POA: Diagnosis not present

## 2018-09-12 DIAGNOSIS — M546 Pain in thoracic spine: Secondary | ICD-10-CM | POA: Diagnosis not present

## 2018-09-12 DIAGNOSIS — M542 Cervicalgia: Secondary | ICD-10-CM | POA: Diagnosis not present

## 2018-10-24 DIAGNOSIS — K5000 Crohn's disease of small intestine without complications: Secondary | ICD-10-CM | POA: Diagnosis not present

## 2019-04-11 IMAGING — CT CT ENTEROGRAPHY (ABD-PELV W/ CM)
1 of 3 series · 13 of 32 positions shown, 19 images · IV contrast (APPLIED)
Comparison: None.

CLINICAL DATA: Patient with severe abdominal pain and cramping for
5 years. Question Crohn's disease

EXAM:
CT ABDOMEN AND PELVIS WITH CONTRAST (ENTEROGRAPHY)
TECHNIQUE: Multidetector CT of the abdomen and pelvis during bolus
administration of intravenous contrast. Negative oral contrast was
given.
CONTRAST:  100mL NUUBVY-G99 IOPAMIDOL (NUUBVY-G99) INJECTION 61%

[Series 3: enterography 3 mm · axial · 0.79mm/px · z∈[-548,-56]mm · 13 of 190 slices shown, 19 images]
[im 13/190  soft-tissue]
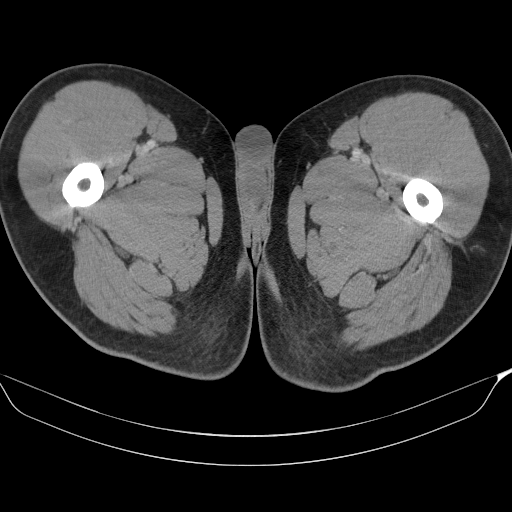
[im 13/190  bone]
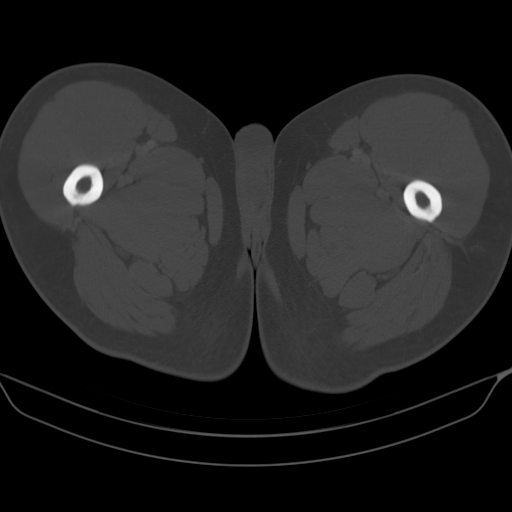
[im 26/190  soft-tissue]
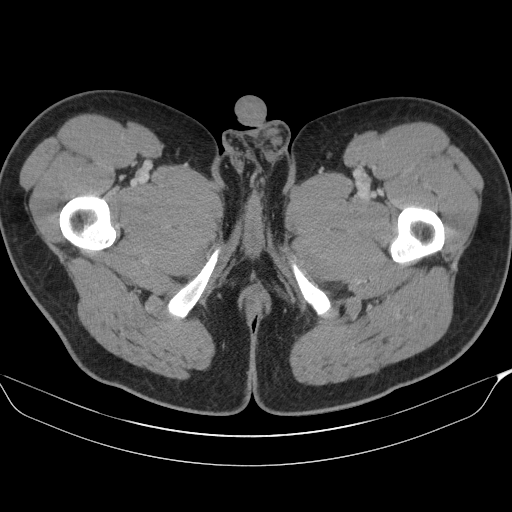
[im 38/190  soft-tissue]
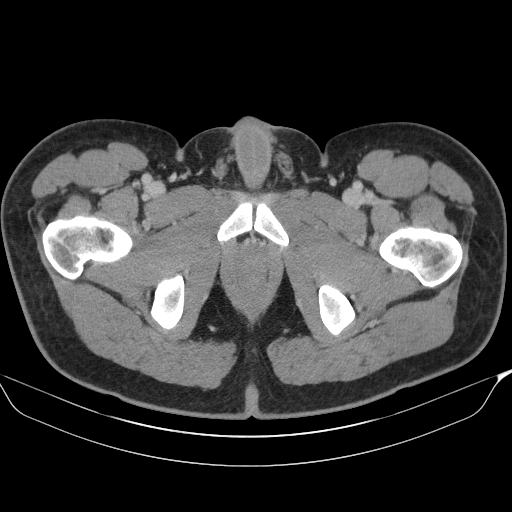
[im 51/190  soft-tissue]
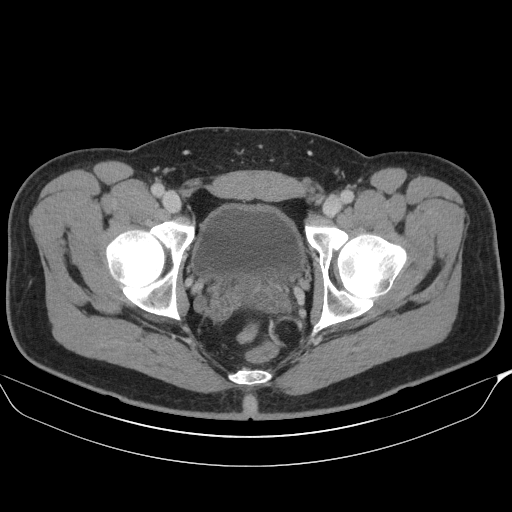
[im 64/190  soft-tissue]
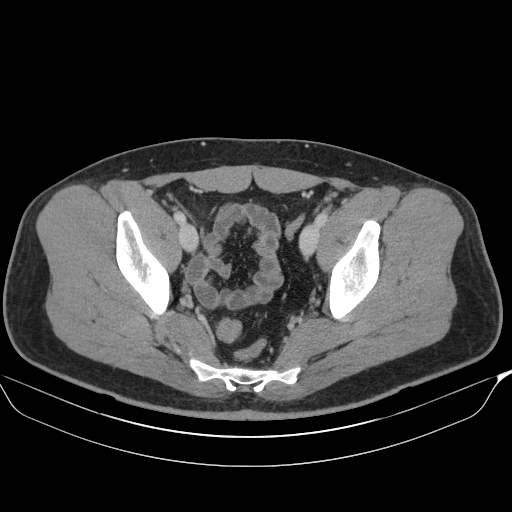
[im 76/190  soft-tissue]
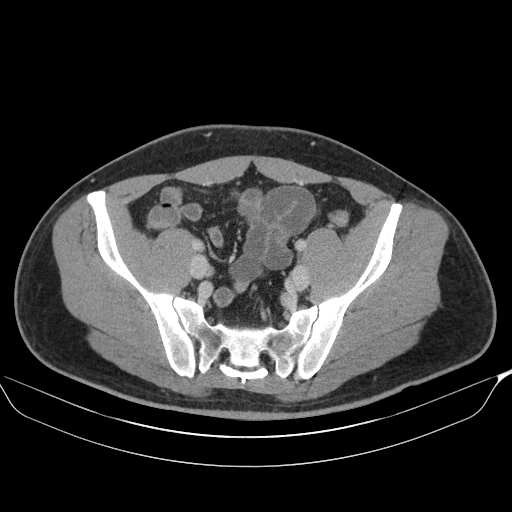
[im 101/190  soft-tissue]
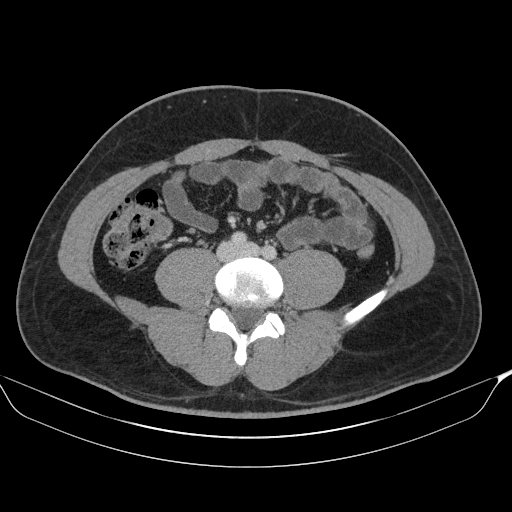
[im 114/190  soft-tissue]
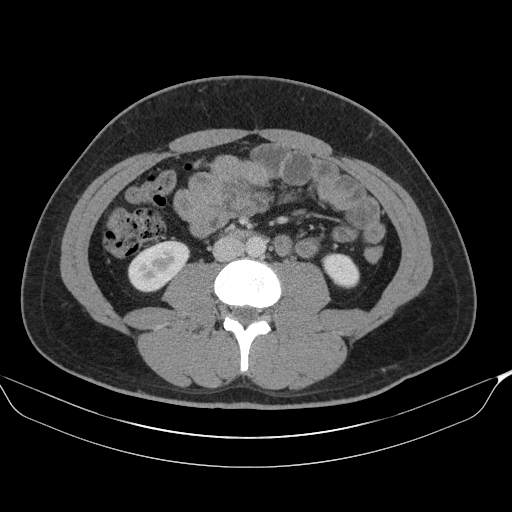
[im 127/190  soft-tissue]
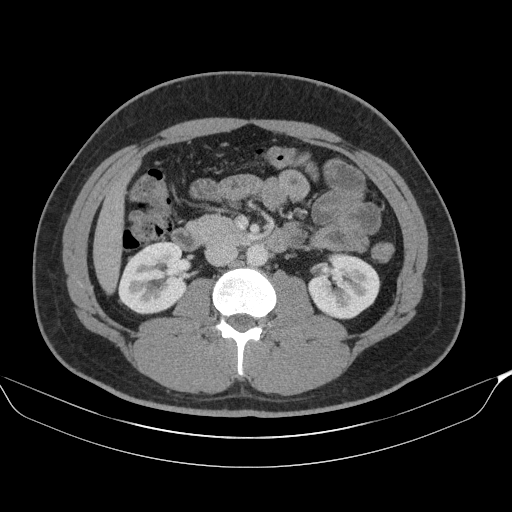
[im 127/190  bone]
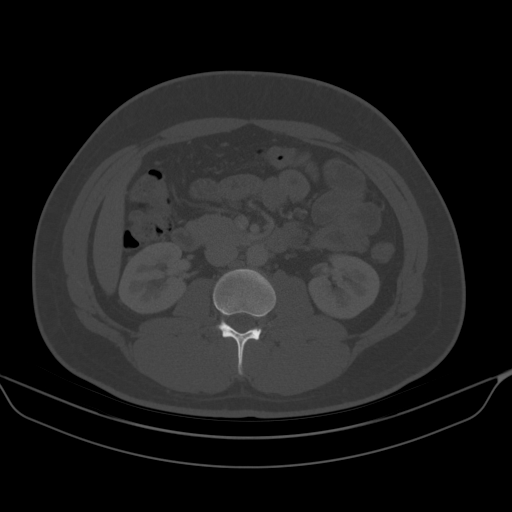
[im 139/190  soft-tissue]
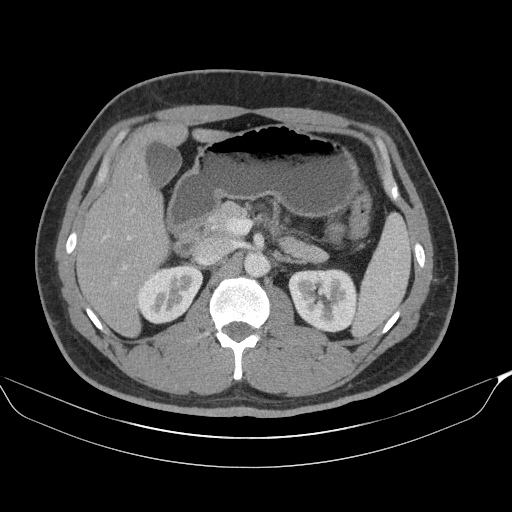
[im 139/190  lung]
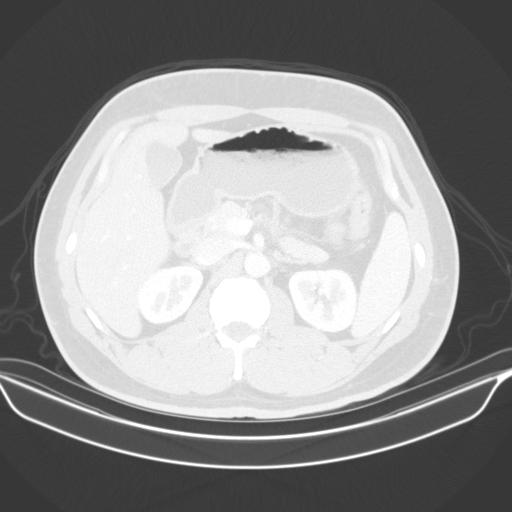
[im 152/190  soft-tissue]
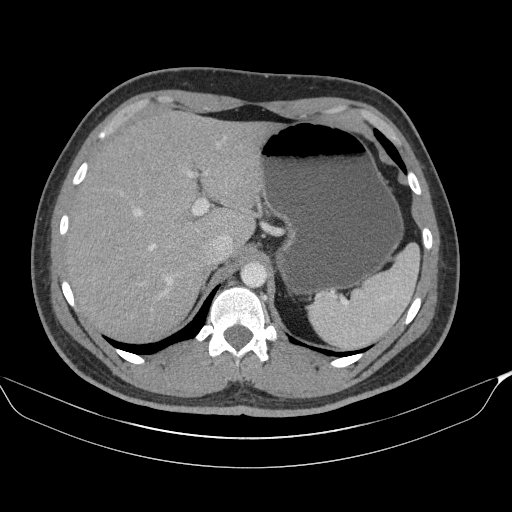
[im 152/190  lung]
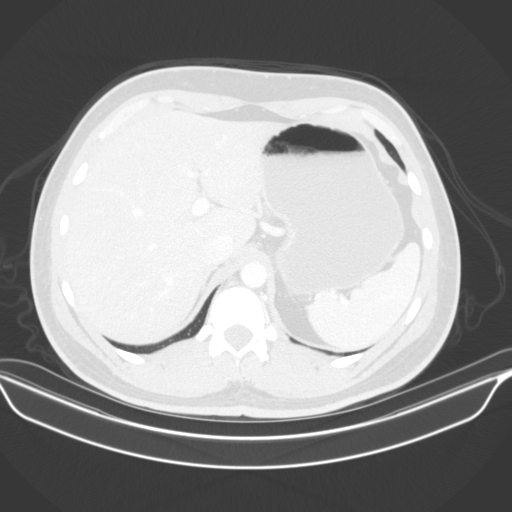
[im 164/190  soft-tissue]
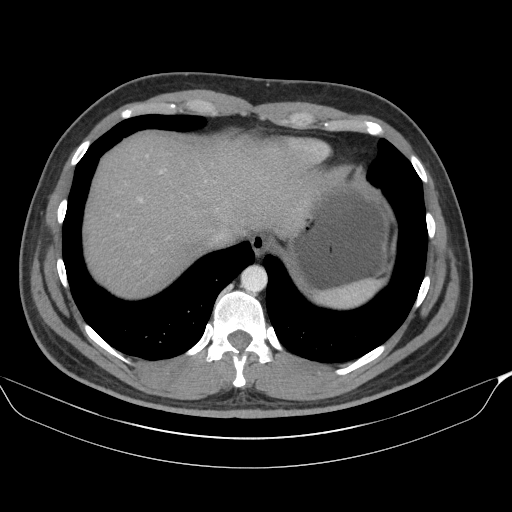
[im 164/190  lung]
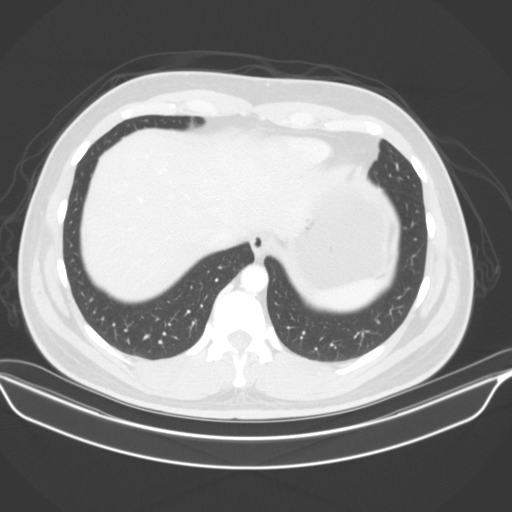
[im 177/190  soft-tissue]
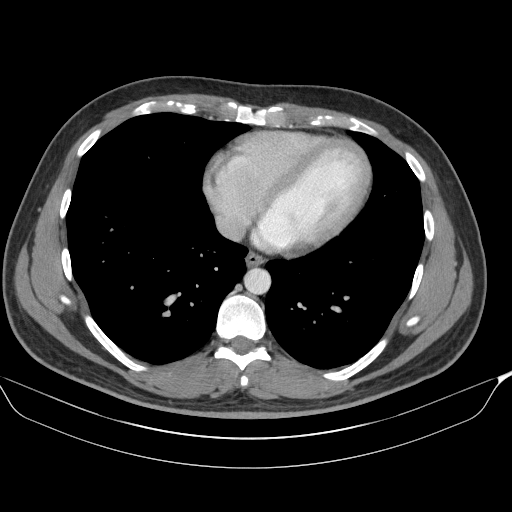
[im 177/190  lung]
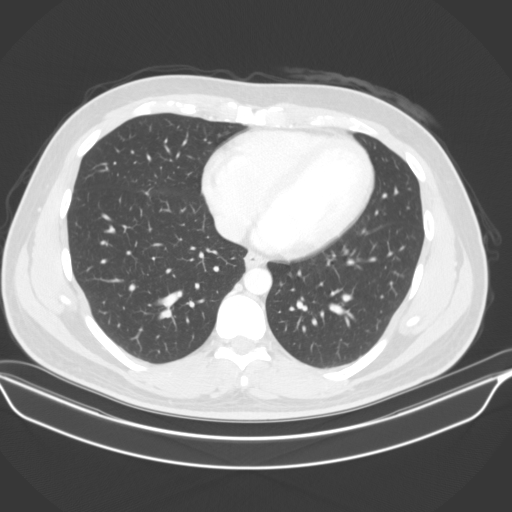

[13 of 32 positions shown; findings below may reference images not displayed]

FINDINGS: Lower chest: Normal heart size. Lung bases are clear. No pleural
effusion.

Hepatobiliary: Liver is normal in size and contour. No focal hepatic
lesion is identified. Gallbladder is unremarkable. No intrahepatic
or extrahepatic biliary ductal dilatation.

Pancreas: Unremarkable

Spleen: Unremarkable

Adrenals/Urinary Tract: Normal adrenal glands. Kidneys enhance
symmetrically with contrast. No hydronephrosis. Mild wall thickening
urinary bladder.

Stomach/Bowel: No abnormal bowel wall thickening or evidence for
bowel obstruction. Normal morphology of the stomach. Normal
appendix. No inflammatory changes or fat stranding involving the
small bowel mesentery.

Vascular/Lymphatic: Normal caliber abdominal aorta. No
retroperitoneal lymphadenopathy.

Reproductive: Unremarkable.

Other: None.

Musculoskeletal: No aggressive or acute appearing osseous lesions.
IMPRESSION: No acute findings within the abdomen or pelvis

## 2019-05-28 DIAGNOSIS — R768 Other specified abnormal immunological findings in serum: Secondary | ICD-10-CM | POA: Diagnosis not present

## 2019-05-28 DIAGNOSIS — K5000 Crohn's disease of small intestine without complications: Secondary | ICD-10-CM | POA: Diagnosis not present

## 2019-05-30 DIAGNOSIS — K5000 Crohn's disease of small intestine without complications: Secondary | ICD-10-CM | POA: Diagnosis not present

## 2019-05-30 DIAGNOSIS — R768 Other specified abnormal immunological findings in serum: Secondary | ICD-10-CM | POA: Diagnosis not present

## 2019-05-31 DIAGNOSIS — R768 Other specified abnormal immunological findings in serum: Secondary | ICD-10-CM | POA: Diagnosis not present

## 2019-05-31 DIAGNOSIS — K5000 Crohn's disease of small intestine without complications: Secondary | ICD-10-CM | POA: Diagnosis not present

## 2020-01-25 ENCOUNTER — Ambulatory Visit: Payer: BC Managed Care – PPO | Attending: Internal Medicine

## 2020-01-25 ENCOUNTER — Other Ambulatory Visit: Payer: Self-pay

## 2020-01-25 DIAGNOSIS — Z23 Encounter for immunization: Secondary | ICD-10-CM

## 2020-01-25 NOTE — Progress Notes (Signed)
   Covid-19 Vaccination Clinic  Name:  Cameron Walker    MRN: [id number] DOB: [birth year]  01/25/2020  Mr. Cameron Walker was observed post Covid-19 immunization for 15 minutes without incident. He was provided with Vaccine Information Sheet and instruction to access the V-Safe system.   Mr. Cameron Walker was instructed to call 911 with any severe reactions post vaccine: Marland Kitchen Difficulty breathing  . Swelling of face and throat  . A fast heartbeat  . A bad rash all over body  . Dizziness and weakness   Immunizations Administered    Name Date Dose VIS Date Route   Pfizer COVID-19 Vaccine 01/25/2020 10:53 AM 0.3 mL 10/26/2019 Intramuscular   Manufacturer: [employer], [employer]   Lot: [id number]   NDC: [address]-6

## 2020-02-18 ENCOUNTER — Encounter: Payer: Self-pay | Admitting: Family Medicine

## 2020-02-18 ENCOUNTER — Ambulatory Visit (INDEPENDENT_AMBULATORY_CARE_PROVIDER_SITE_OTHER): Payer: BC Managed Care – PPO | Admitting: Family Medicine

## 2020-02-18 VITALS — Temp 97.0°F | Wt 225.0 lb

## 2020-02-18 DIAGNOSIS — J302 Other seasonal allergic rhinitis: Secondary | ICD-10-CM

## 2020-02-18 DIAGNOSIS — J45990 Exercise induced bronchospasm: Secondary | ICD-10-CM

## 2020-02-18 MED ORDER — ALBUTEROL SULFATE HFA 108 (90 BASE) MCG/ACT IN AERS: 2.0000 | INHALATION_SPRAY | Freq: Four times a day (QID) | RESPIRATORY_TRACT | 0 refills | Status: DC | PRN

## 2020-02-18 NOTE — Progress Notes (Signed)
I connected with Cameron Walker on 02/18/20 at  3:20 PM EDT by video and verified that I am speaking with the correct person using two identifiers.   I discussed the limitations, risks, security and privacy concerns of performing an evaluation and management service by video and the availability of in person appointments. I also discussed with the patient that there may be a patient responsible charge related to this service. The patient expressed understanding and agreed to proceed.  Patient location: Home Provider Location: [facility] Participants: Cameron Walker and Cameron Walker   Subjective:     Cameron Walker is a 29 y.o. male presenting for URI (Statred about 1-2 weeks ago with alot of nasal congestion and postnasal drip. Started when pollen came out. Was taking Flonase but started having nosebleeds. Taking Claritin.)     URI  This is a new problem. The current episode started 1 to 4 weeks ago. There has been no fever. Associated symptoms include congestion, coughing, rhinorrhea, sneezing and a sore throat. Pertinent negatives include no chest pain, diarrhea (chronic diarrhea), ear pain, nausea or vomiting. He has tried antihistamine (nasal steroid) for the symptoms. The treatment provided mild relief.   Symptoms started when the pollen started Loss of taste/smell: no Sick contact: no  Review of Systems  HENT: Positive for congestion, postnasal drip, rhinorrhea, sneezing and sore throat. Negative for ear pain.   Respiratory: Positive for cough.   Cardiovascular: Negative for chest pain.  Gastrointestinal: Negative for diarrhea (chronic diarrhea), nausea and vomiting.     Social History   Tobacco Use  Smoking Status Never Smoker  Smokeless Tobacco Never Used        Objective:   BP Readings from Last 3 Encounters:  08/21/18 136/80  12/01/17 122/70  09/09/17 (!) 142/82   Wt Readings from Last 3 Encounters:  02/18/20 225 lb (102.1 kg)  08/21/18 209  lb (94.8 kg)  12/01/17 211 lb 12 oz (96 kg)     Temp (!) 97 F (36.1 C)   Wt 225 lb (102.1 kg)   BMI 30.30 kg/m    Physical Exam Constitutional:      Appearance: Normal appearance. He is not ill-appearing.  HENT:     Head: Normocephalic and atraumatic.     Right Ear: External ear normal.     Left Ear: External ear normal.  Eyes:     Conjunctiva/sclera: Conjunctivae normal.  Pulmonary:     Effort: Pulmonary effort is normal. No respiratory distress.  Neurological:     Mental Status: He is alert. Mental status is at baseline.  Psychiatric:        Mood and Affect: Mood normal.        Behavior: Behavior normal.        Thought Content: Thought content normal.        Judgment: Judgment normal.           Assessment & Plan:   Problem List Items Addressed This Visit      Respiratory   Exercise-induced asthma   Relevant Medications   albuterol (VENTOLIN HFA) 108 (90 Base) MCG/ACT inhaler     Other   Seasonal allergies - Primary    Given hx and timing of symptoms and lack of red flags for covid, suspect allergies. Though if no improvement after 2 days of treatment recommend covid testing to rule out. Pt already receiving 1 dose of vaccine so again lower risk.  Return if symptoms worsen or fail to improve.  Lesleigh Noe, MD

## 2020-02-18 NOTE — Patient Instructions (Addendum)
Most likely allergies  1) Continue claritin 2) Start saline spray -- if nose bleeds improve, restart flonase 3) Consider saline rinse/neti pot at night  Throat pain - likely post nasal drip - treat as above - Cough drops - Ibuprofen for severe discomfort  If no improvement in 2 days would recommend covid-19 testing  Text "COVID" to [phone] to schedule testing with [facility] Or could [facility] to [facility]

## 2020-02-18 NOTE — Assessment & Plan Note (Signed)
Given hx and timing of symptoms and lack of red flags for covid, suspect allergies. Though if no improvement after 2 days of treatment recommend covid testing to rule out. Pt already receiving 1 dose of vaccine so again lower risk.

## 2020-02-20 ENCOUNTER — Ambulatory Visit: Payer: BC Managed Care – PPO | Attending: Internal Medicine

## 2020-02-20 DIAGNOSIS — Z23 Encounter for immunization: Secondary | ICD-10-CM

## 2020-02-20 NOTE — Progress Notes (Signed)
   Covid-19 Vaccination Clinic  Name:  FLYNN GWYN    MRN: [id number] DOB: 18-Sep-1991  02/20/2020  Mr. Congrove was observed post Covid-19 immunization for 15 minutes without incident. He was provided with Vaccine Information Sheet and instruction to access the V-Safe system.   Mr. Yin was instructed to call 911 with any severe reactions post vaccine: Marland Kitchen Difficulty breathing  . Swelling of face and throat  . A fast heartbeat  . A bad rash all over body  . Dizziness and weakness   Immunizations Administered    Name Date Dose VIS Date Route   Pfizer COVID-19 Vaccine 02/20/2020  8:29 AM 0.3 mL 10/26/2019 Intramuscular   Manufacturer: [employer], [employer]   Lot: [id number]   NDC: [address]-4

## 2020-02-26 DIAGNOSIS — Z20828 Contact with and (suspected) exposure to other viral communicable diseases: Secondary | ICD-10-CM | POA: Diagnosis not present

## 2020-02-26 DIAGNOSIS — Z03818 Encounter for observation for suspected exposure to other biological agents ruled out: Secondary | ICD-10-CM | POA: Diagnosis not present

## 2020-02-29 ENCOUNTER — Telehealth (INDEPENDENT_AMBULATORY_CARE_PROVIDER_SITE_OTHER): Payer: BC Managed Care – PPO | Admitting: Family Medicine

## 2020-02-29 ENCOUNTER — Encounter: Payer: Self-pay | Admitting: Family Medicine

## 2020-02-29 DIAGNOSIS — J0110 Acute frontal sinusitis, unspecified: Secondary | ICD-10-CM

## 2020-02-29 DIAGNOSIS — J0140 Acute pansinusitis, unspecified: Secondary | ICD-10-CM | POA: Insufficient documentation

## 2020-02-29 DIAGNOSIS — J019 Acute sinusitis, unspecified: Secondary | ICD-10-CM | POA: Insufficient documentation

## 2020-02-29 MED ORDER — AMOXICILLIN-POT CLAVULANATE 875-125 MG PO TABS: 1.0000 | ORAL_TABLET | Freq: Two times a day (BID) | ORAL | 0 refills | Status: DC

## 2020-02-29 NOTE — Progress Notes (Signed)
Virtual Visit via Video Note  I connected with Cameron Walker on 02/29/20 at 10:00 AM EDT by a video enabled telemedicine application and verified that I am speaking with the correct person using two identifiers.  Location: Patient: home Provider: office    I discussed the limitations of evaluation and management by telemedicine and the availability of in person appointments. The patient expressed understanding and agreed to proceed.  Parties involved in encounter  Patient: Cameron Walker  Provider:  Roxy Manns MD    History of Present Illness: 29 yo M pt of NP Sampson Si presents with c/o sinus problems   He was seen early in the month by Dr Selena Batten for congestion and seasonal allergies   Dx with exercise induced asthma - ventolin  For all rhinitis adv to continue claritin and start saline ns or netti pot   covid vaccines - had both   Now having more sinus drainage and pnd  Facial pain /headache  Had low grade temp 99.5 - now gone  Is tired No chills  ST-back and top from drip   Nasal dc is dark green  Also phlegm he coughs up - turns from green to yellow  Some wheezing - not bad however  Inhaler helps   Some mild productive cough am  Got tested for covid with neg result yesterday   Saline helps a bit at night   He has had surgery on sinuses/septum in the past -this helped  Does not get sinus infections as often now   Does ok with augmentin /cannot take cephalosporins    nyquil helps     Patient Active Problem List   Diagnosis Date Noted  . Acute sinusitis 02/29/2020  . Exercise-induced asthma 02/18/2020  . HTN (hypertension) 11/30/2016  . Seasonal allergies 11/30/2016  . GERD (gastroesophageal reflux disease) 11/30/2016   Past Medical History:  Diagnosis Date  . Allergy   . GERD (gastroesophageal reflux disease)   . Hypertension    Past Surgical History:  Procedure Laterality Date  . SINUSOTOMY  2005   Social History   Tobacco Use  . Smoking status:  Never Smoker  . Smokeless tobacco: Never Used  Substance Use Topics  . Alcohol use: Yes    Alcohol/week: 1.0 standard drinks    Types: 1 Cans of beer per week    Comment: occasional  . Drug use: No   Family History  Problem Relation Age of Onset  . Hypertension Father   . Arthritis Maternal Grandmother   . Diabetes Maternal Grandmother   . Cancer Paternal Grandfather        throat  . Stroke Neg Hx    Allergies  Allergen Reactions  . Cephalosporins   . Sulfa Antibiotics Hives   Current Outpatient Medications on File Prior to Visit  Medication Sig Dispense Refill  . albuterol (VENTOLIN HFA) 108 (90 Base) MCG/ACT inhaler Inhale 2 puffs into the lungs every 6 (six) hours as needed for wheezing or shortness of breath (or for cough). 18 g 0  . esomeprazole (NEXIUM) 20 MG capsule Take 20 mg by mouth daily as needed.     . fluticasone (FLONASE) 50 MCG/ACT nasal spray Place 2 sprays into both nostrils daily.    Marland Kitchen loratadine (CLARITIN) 10 MG tablet Take 10 mg by mouth daily.    . mesalamine (APRISO) 0.375 g 24 hr capsule Take 1.5 g by mouth every morning.     No current facility-administered medications on file prior to visit.  Review of Systems  Constitutional: Positive for malaise/fatigue. Negative for chills and fever.  HENT: Positive for congestion, sinus pain and sore throat. Negative for ear pain.   Eyes: Negative for blurred vision, discharge and redness.  Respiratory: Positive for cough, sputum production and wheezing. Negative for shortness of breath and stridor.   Cardiovascular: Negative for chest pain, palpitations and leg swelling.  Gastrointestinal: Negative for abdominal pain, diarrhea, nausea and vomiting.  Musculoskeletal: Negative for myalgias.  Skin: Negative for rash.  Neurological: Negative for dizziness and headaches.    Observations/Objective: Patient appears well, in no distress Weight is baseline  No facial swelling or asymmetry (indicated areas of pain  in face around eyes)  Normal voice-not hoarse and no slurred speech No obvious tremor or mobility impairment Moving neck and UEs normally Able to hear the call well  No cough or shortness of breath during interview  Talkative and mentally sharp with no cognitive changes No skin changes on face or neck , no rash or pallor Affect is normal    Assessment and Plan: Problem List Items Addressed This Visit      Respiratory   Acute sinusitis    S/p over 2 weeks of allergy congestion   With neg covid test yesterday (and fully immunized)  Now purulent nasal drainage and sinus pain  Px augmentin to take bid for a week Fluids/saline nyquil prn  Ventolin prn Update if not starting to improve in a week or if worsening   Would consider adding prednisone if needed        Relevant Medications   amoxicillin-clavulanate (AUGMENTIN) 875-125 MG tablet       Follow Up Instructions: Continue allergy medicine and inhaler as needed ny quil is ok if helpful  Take the augmentin as directed for 7 days  Update if not starting to improve in a week or if worsening  (or if any new symptoms)    I discussed the assessment and treatment plan with the patient. The patient was provided an opportunity to ask questions and all were answered. The patient agreed with the plan and demonstrated an understanding of the instructions.   The patient was advised to call back or seek an in-person evaluation if the symptoms worsen or if the condition fails to improve as anticipated.   Cameron Pardon, MD

## 2020-02-29 NOTE — Patient Instructions (Signed)
Continue allergy medicine and inhaler as needed ny quil is ok if helpful  Take the augmentin as directed for 7 days  Update if not starting to improve in a week or if worsening  (or if any new symptoms)

## 2020-02-29 NOTE — Assessment & Plan Note (Addendum)
S/p over 2 weeks of allergy congestion   With neg covid test yesterday (and fully immunized)  Now purulent nasal drainage and sinus pain  Px augmentin to take bid for a week Fluids/saline nyquil prn  Ventolin prn Update if not starting to improve in a week or if worsening   Would consider adding prednisone if needed

## 2020-03-15 ENCOUNTER — Other Ambulatory Visit: Payer: Self-pay | Admitting: Family Medicine

## 2020-03-15 DIAGNOSIS — J45990 Exercise induced bronchospasm: Secondary | ICD-10-CM

## 2020-05-05 DIAGNOSIS — K529 Noninfective gastroenteritis and colitis, unspecified: Secondary | ICD-10-CM | POA: Diagnosis not present

## 2020-05-06 DIAGNOSIS — K529 Noninfective gastroenteritis and colitis, unspecified: Secondary | ICD-10-CM | POA: Diagnosis not present

## 2020-08-26 DIAGNOSIS — Z20822 Contact with and (suspected) exposure to covid-19: Secondary | ICD-10-CM | POA: Diagnosis not present

## 2020-10-30 DIAGNOSIS — Z20822 Contact with and (suspected) exposure to covid-19: Secondary | ICD-10-CM | POA: Diagnosis not present

## 2020-11-14 DIAGNOSIS — Z20822 Contact with and (suspected) exposure to covid-19: Secondary | ICD-10-CM | POA: Diagnosis not present

## 2021-01-22 DIAGNOSIS — I10 Essential (primary) hypertension: Secondary | ICD-10-CM | POA: Diagnosis not present

## 2021-01-22 DIAGNOSIS — K529 Noninfective gastroenteritis and colitis, unspecified: Secondary | ICD-10-CM | POA: Diagnosis not present

## 2021-01-28 ENCOUNTER — Other Ambulatory Visit: Payer: Self-pay

## 2021-01-28 ENCOUNTER — Ambulatory Visit: Payer: BC Managed Care – PPO | Admitting: Internal Medicine

## 2021-01-28 ENCOUNTER — Encounter: Payer: Self-pay | Admitting: Internal Medicine

## 2021-01-28 VITALS — BP 148/100 | HR 78 | Temp 98.4°F | Wt 234.0 lb

## 2021-01-28 DIAGNOSIS — I10 Essential (primary) hypertension: Secondary | ICD-10-CM

## 2021-01-28 DIAGNOSIS — R519 Headache, unspecified: Secondary | ICD-10-CM | POA: Diagnosis not present

## 2021-01-28 DIAGNOSIS — Z6831 Body mass index (BMI) 31.0-31.9, adult: Secondary | ICD-10-CM | POA: Diagnosis not present

## 2021-01-28 DIAGNOSIS — E6609 Other obesity due to excess calories: Secondary | ICD-10-CM

## 2021-01-28 MED ORDER — LOSARTAN POTASSIUM-HCTZ 50-12.5 MG PO TABS: 0.5000 | ORAL_TABLET | Freq: Every day | ORAL | 0 refills | Status: DC

## 2021-01-28 NOTE — Patient Instructions (Signed)

## 2021-01-28 NOTE — Progress Notes (Signed)
Subjective:    Patient ID: Cameron Walker, male    DOB: Sep 27, 1991, 30 y.o.   MRN: [id number]  HPI  Pt presents to the clinic today for follow up for elevated blood pressure. He reports he was seen by GI on 3/10. His BP was elevated at that visit. He has been monitoring his BP and reports it is running 160/100 at home. He has been having headaches. He reports the headache is generalized and describes it as an ache. He denies associated dizziness, lightheadedness, visual changes, sensitivity to light or sound, nausea or vomiting. He has never been treated for HTN in the past. His BP today is 148/100.  Review of Systems  Past Medical History:  Diagnosis Date  . Allergy   . GERD (gastroesophageal reflux disease)   . Hypertension     Current Outpatient Medications  Medication Sig Dispense Refill  . albuterol (VENTOLIN HFA) 108 (90 Base) MCG/ACT inhaler Inhale 2 puffs into the lungs every 6 (six) hours as needed for wheezing or shortness of breath (or for cough). 18 g 0  . amoxicillin-clavulanate (AUGMENTIN) 875-125 MG tablet Take 1 tablet by mouth 2 (two) times daily. 14 tablet 0  . esomeprazole (NEXIUM) 20 MG capsule Take 20 mg by mouth daily as needed.     . fluticasone (FLONASE) 50 MCG/ACT nasal spray Place 2 sprays into both nostrils daily.    Marland Kitchen loratadine (CLARITIN) 10 MG tablet Take 10 mg by mouth daily.    . mesalamine (APRISO) 0.375 g 24 hr capsule Take 1.5 g by mouth every morning.     No current facility-administered medications for this visit.    Allergies  Allergen Reactions  . Cephalosporins   . Sulfa Antibiotics Hives    Family History  Problem Relation Age of Onset  . Hypertension Father   . Arthritis Maternal Grandmother   . Diabetes Maternal Grandmother   . Cancer Paternal Grandfather        throat  . Stroke Neg Hx     Social History   Socioeconomic History  . Marital status: Unknown    Spouse name: Not on file  . Number of children: Not on file  .  Years of education: Not on file  . Highest education level: Not on file  Occupational History  . Not on file  Tobacco Use  . Smoking status: Never Smoker  . Smokeless tobacco: Never Used  Substance and Sexual Activity  . Alcohol use: Yes    Alcohol/week: 1.0 standard drink    Types: 1 Cans of beer per week    Comment: occasional  . Drug use: No  . Sexual activity: Yes  Other Topics Concern  . Not on file  Social History Narrative  . Not on file   Social Determinants of Health   Financial Resource Strain: Not on file  Food Insecurity: Not on file  Transportation Needs: Not on file  Physical Activity: Not on file  Stress: Not on file  Social Connections: Not on file  Intimate Partner Violence: Not on file     Constitutional: Pt reports frequent headaches. Denies fever, malaise, fatigue, or abrupt weight changes.  HEENT: Denies eye pain, eye redness, ear pain, ringing in the ears, wax buildup, runny nose, nasal congestion, bloody nose, or sore throat. Respiratory: Denies difficulty breathing, shortness of breath, cough or sputum production.   Cardiovascular: Denies chest pain, chest tightness, palpitations or swelling in the hands or feet.  Neurological: Denies dizziness, difficulty with memory,  difficulty with speech or problems with balance and coordination.  Psych: Pt reports stress. Denies anxiety, depression, SI/HI.  No other specific complaints in a complete review of systems (except as listed in HPI above).     Objective:   Physical Exam  BP (!) 148/100   Pulse 78   Temp 98.4 F (36.9 C) (Temporal)   Wt 234 lb (106.1 kg)   SpO2 98%   BMI 31.52 kg/m   Wt Readings from Last 3 Encounters:  02/29/20 219 lb (99.3 kg)  02/18/20 225 lb (102.1 kg)  08/21/18 209 lb (94.8 kg)    General: Appears his stated age, obese, in NAD. HEENT: Head: normal shape and size; Eyes: sclera white, no icterus, conjunctiva pink, PERRLA and EOMs intact; Cardiovascular: Normal rate  and rhythm. S1,S2 noted.  No murmur, rubs or gallops noted. No JVD or BLE edema.  Pulmonary/Chest: Normal effort and positive vesicular breath sounds. No respiratory distress. No wheezes, rales or ronchi noted.  Neurological: Alert and oriented.  Coordination normal.    BMET    Component Value Date/Time   NA 138 12/01/2017 0848   K 4.3 12/01/2017 0848   CL 102 12/01/2017 0848   CO2 31 12/01/2017 0848   GLUCOSE 93 12/01/2017 0848   BUN 10 12/01/2017 0848   CREATININE 0.97 12/01/2017 0848   CALCIUM 9.7 12/01/2017 0848    Lipid Panel     Component Value Date/Time   CHOL 143 12/01/2017 0848   TRIG 88.0 12/01/2017 0848   HDL 47.00 12/01/2017 0848   CHOLHDL 3 12/01/2017 0848   VLDL 17.6 12/01/2017 0848   LDLCALC 79 12/01/2017 0848    CBC    Component Value Date/Time   WBC 4.3 12/01/2017 0848   RBC 4.80 12/01/2017 0848   HGB 15.2 12/01/2017 0848   HCT 44.9 12/01/2017 0848   PLT 299.0 12/01/2017 0848   MCV 93.6 12/01/2017 0848   MCHC 33.9 12/01/2017 0848   RDW 13.3 12/01/2017 0848    Hgb A1C Lab Results  Component Value Date   HGBA1C 5.4 11/30/2016           Assessment & Plan:     Nicki Reaper, NP This visit occurred during the SARS-CoV-2 public health emergency.  Safety protocols were in place, including screening questions prior to the visit, additional usage of staff PPE, and extensive cleaning of exam room while observing appropriate contact time as indicated for disinfecting solutions.

## 2021-01-28 NOTE — Assessment & Plan Note (Signed)
Uncontrolled Will start Losartan HCT 50-12.5, 1/2 tab daily x 1 week Update me in 1 week via mychart with BP readings Reinforced DASH diet and exercise for weight loss CMET from 3/10 at [facility] reviewed

## 2021-02-04 ENCOUNTER — Encounter: Payer: Self-pay | Admitting: Internal Medicine

## 2021-02-12 MED ORDER — LOSARTAN POTASSIUM-HCTZ 50-12.5 MG PO TABS: 1.0000 | ORAL_TABLET | Freq: Every day | ORAL | 0 refills | Status: DC

## 2021-02-12 MED ORDER — LOSARTAN POTASSIUM-HCTZ 50-12.5 MG PO TABS: 2.0000 | ORAL_TABLET | Freq: Every day | ORAL | 0 refills | Status: DC

## 2021-02-12 NOTE — Addendum Note (Signed)
Addended by: Lorre Munroe on: 02/12/2021 01:44 PM   Modules accepted: Orders

## 2021-03-21 ENCOUNTER — Other Ambulatory Visit: Payer: Self-pay | Admitting: Internal Medicine

## 2021-03-26 ENCOUNTER — Telehealth: Payer: Self-pay

## 2021-03-26 NOTE — Telephone Encounter (Signed)
Staying at [facility] 

## 2021-04-24 ENCOUNTER — Other Ambulatory Visit: Payer: Self-pay | Admitting: Internal Medicine

## 2021-05-08 DIAGNOSIS — I10 Essential (primary) hypertension: Secondary | ICD-10-CM | POA: Diagnosis not present

## 2021-05-08 DIAGNOSIS — R1011 Right upper quadrant pain: Secondary | ICD-10-CM | POA: Diagnosis not present

## 2021-05-08 DIAGNOSIS — K5090 Crohn's disease, unspecified, without complications: Secondary | ICD-10-CM | POA: Diagnosis not present

## 2021-05-10 NOTE — Progress Notes (Signed)
Cameron Walker T. Cameron Daughdrill, MD, CAQ Sports Medicine [facility] [address] [place], [address]  Phone: [phone]  FAX: [phone]  Cameron Walker - 30 y.o. male  MRN [id number]  Date of Birth: 15-Dec-1990  Date: 05/11/2021  PCP: Lorre Munroe, NP  Referral: Lorre Munroe, NP  Chief Complaint  Patient presents with   Pain in Lower Right Rib Cage    Seen at [facility] on Friday   Fatigue   Headache    Negative Covid Test on Wednesday     This visit occurred during the SARS-CoV-2 public health emergency.  Safety protocols were in place, including screening questions prior to the visit, additional usage of staff PPE, and extensive cleaning of exam room while observing appropriate contact time as indicated for disinfecting solutions.   Subjective:   Cameron Walker is a 30 y.o. very pleasant male patient with Body mass index is 29.9 kg/m. who presents with the following:  He presents with for an evaluation of rib pain.  On secondary questioning, he has a few other questions as well.  BP: higher. Works 60+ hours a week.  He is currently on losartan 100 mg and hydrochlorothiazide 25 mg. He does report compliance.  Week before.  Last week.  Felt like he had some pain on the R side.   Has not gotten away. Flank will have some pain. He does have Crohn's disease. He is on mesalamine.  Predominant pain is in the right upper quadrant.  Same time had some pins and needles in his abdomen.  No n/v/d.    More gas than usual. Decreased appetite.   Exhausted.  Pretty much all the time. Has had some headache.  No ST.  Covid test 4-5 days in when he went to urgent care.  He thinks that they also did a liver panel.  No CP. No new SOB.  Went to Urgent Care last Friday. Thought no appy  BP concerns. 135-140 systolic in the evening.  134/92  BP:  Hyzaar 100/25 BP Readings from Last 3 Encounters:  05/11/21 124/90   01/28/21 (!) 148/100  02/29/20 (!) 139/91      Review of Systems is noted in the HPI, as appropriate  Patient Active Problem List   Diagnosis Date Noted   Crohn's disease (HCC) 05/11/2021   Exercise-induced asthma 02/18/2020   HTN (hypertension) 11/30/2016   Seasonal allergies 11/30/2016   GERD (gastroesophageal reflux disease) 11/30/2016    Past Medical History:  Diagnosis Date   Allergy    Crohn's disease (HCC) 05/11/2021   [facility] follows   GERD (gastroesophageal reflux disease)    Hypertension     Past Surgical History:  Procedure Laterality Date   SINUSOTOMY  2005    Family History  Problem Relation Age of Onset   Hypertension Father    Arthritis Maternal Grandmother    Diabetes Maternal Grandmother    Cancer Paternal Grandfather        throat   Stroke Neg Hx      Objective:   BP 124/90   Pulse 84   Temp (!) 97.5 F (36.4 C) (Temporal)   Ht 6' 0.5" (1.842 m)   Wt 223 lb 8 oz (101.4 kg)   SpO2 97%   BMI 29.90 kg/m   GEN: No acute distress; alert,appropriate. PULM: Breathing comfortably in no respiratory distress PSYCH: Normally interactive.  CV: RRR, no m/g/r  PULM: Normal respiratory rate,  no accessory muscle use. No wheezes, crackles or rhonchi  ABD: S, tender just underneath the right bottom ribs adjacent to the liver and approaching the gallbladder., ND, + BS, No rebound, No HSM   Laboratory and Imaging Data:  Assessment and Plan:     ICD-10-CM   1. RUQ pain  R10.11 Lipase    US ABDOMEN LIMITED RUQ (LIVER/GB)    2. Primary hypertension  I10     3. Acute non intractable tension-type headache  G44.209     4. Other fatigue  R53.83 Basic metabolic panel    CBC with Differential/Platelet    IBC + Ferritin    Vitamin B12    TSH    T3, free    T4, free    5. Crohn's disease of small intestine without complication (HCC)  K50.00      Total encounter time: 30 minutes. This includes total time spent on the day of encounter.  This  includes record review. Multiple ongoing symptoms.  Right upper quadrant pain outside of the rib.  Obtain a right upper quadrant ultrasound to evaluate gallbladder as well as liver.  Evaluate for cholelithiasis.  Blood pressure is modestly elevated only.  No medication changes.  Recommended more physical activity, work on diet and stressed the best he can and working 60+ hours a week.  Fatigue work-up is broad: Basic labs today. Often a distinct problem is not identified.  Medications Discontinued During This Encounter  Medication Reason   fluticasone (FLONASE ALLERGY RELIEF) 50 MCG/ACT nasal spray Duplicate   Orders Placed This Encounter  Procedures   US ABDOMEN LIMITED RUQ (LIVER/GB)   Basic metabolic panel   CBC with Differential/Platelet   Lipase   IBC + Ferritin   Vitamin B12   TSH   T3, free   T4, free    Follow-up: No follow-ups on file.  Signed,  Elpidio Galea. Javari Bufkin, MD   Outpatient Encounter Medications as of 05/11/2021  Medication Sig   albuterol (VENTOLIN HFA) 108 (90 Base) MCG/ACT inhaler Inhale 2 puffs into the lungs every 6 (six) hours as needed for wheezing or shortness of breath (or for cough).   calcium carbonate (TUMS - DOSED IN MG ELEMENTAL CALCIUM) 500 MG chewable tablet 1 tablet   esomeprazole (NEXIUM) 20 MG capsule Take 20 mg by mouth daily as needed.    fluticasone (FLONASE) 50 MCG/ACT nasal spray Place 2 sprays into both nostrils daily.   loratadine (CLARITIN) 10 MG tablet Take 10 mg by mouth daily.   losartan-hydrochlorothiazide (HYZAAR) 50-12.5 MG tablet TAKE 2 TABLETS BY MOUTH DAILY   mesalamine (APRISO) 0.375 g 24 hr capsule Take 1.5 g by mouth every morning.   [DISCONTINUED] fluticasone (FLONASE ALLERGY RELIEF) 50 MCG/ACT nasal spray 1 spray in each nostril   No facility-administered encounter medications on file as of 05/11/2021.

## 2021-05-11 ENCOUNTER — Ambulatory Visit: Payer: BC Managed Care – PPO | Admitting: Family Medicine

## 2021-05-11 ENCOUNTER — Encounter: Payer: Self-pay | Admitting: Family Medicine

## 2021-05-11 ENCOUNTER — Other Ambulatory Visit: Payer: Self-pay

## 2021-05-11 VITALS — BP 124/90 | HR 84 | Temp 97.5°F | Ht 72.5 in | Wt 223.5 lb

## 2021-05-11 DIAGNOSIS — K5090 Crohn's disease, unspecified, without complications: Secondary | ICD-10-CM

## 2021-05-11 DIAGNOSIS — I10 Essential (primary) hypertension: Secondary | ICD-10-CM

## 2021-05-11 DIAGNOSIS — R5383 Other fatigue: Secondary | ICD-10-CM

## 2021-05-11 DIAGNOSIS — R1011 Right upper quadrant pain: Secondary | ICD-10-CM

## 2021-05-11 DIAGNOSIS — K5000 Crohn's disease of small intestine without complications: Secondary | ICD-10-CM

## 2021-05-11 DIAGNOSIS — G44209 Tension-type headache, unspecified, not intractable: Secondary | ICD-10-CM | POA: Diagnosis not present

## 2021-05-11 HISTORY — DX: Crohn's disease, unspecified, without complications: K50.90

## 2021-05-11 LAB — BASIC METABOLIC PANEL
BUN: 13 mg/dL (ref 6–23)
CO2: 30 mEq/L (ref 19–32)
Calcium: 10 mg/dL (ref 8.4–10.5)
Chloride: 99 mEq/L (ref 96–112)
Creatinine, Ser: 0.98 mg/dL (ref 0.40–1.50)
GFR: 103.17 mL/min (ref >60.00)
Glucose, Bld: 89 mg/dL (ref 70–99)
Potassium: 4.3 mEq/L (ref 3.5–5.1)
Sodium: 137 mEq/L (ref 135–145)

## 2021-05-11 LAB — CBC WITH DIFFERENTIAL/PLATELET
Basophils Absolute: 0.1 10*3/uL (ref 0.0–0.1)
Basophils Relative: 0.8 % (ref 0.0–3.0)
Eosinophils Absolute: 0.3 10*3/uL (ref 0.0–0.7)
Eosinophils Relative: 3.6 % (ref 0.0–5.0)
HCT: 44.7 % (ref 39.0–52.0)
Hemoglobin: 15.3 g/dL (ref 13.0–17.0)
Lymphocytes Relative: 30.2 % (ref 12.0–46.0)
Lymphs Abs: 2.1 10*3/uL (ref 0.7–4.0)
MCHC: 34.2 g/dL (ref 30.0–36.0)
MCV: 91.5 fl (ref 78.0–100.0)
Monocytes Absolute: 0.5 10*3/uL (ref 0.1–1.0)
Monocytes Relative: 7.1 % (ref 3.0–12.0)
Neutro Abs: 4.1 10*3/uL (ref 1.4–7.7)
Neutrophils Relative %: 58.3 % (ref 43.0–77.0)
Platelets: 340 10*3/uL (ref 150.0–400.0)
RBC: 4.88 Mil/uL (ref 4.22–5.81)
RDW: 13.3 % (ref 11.5–15.5)
WBC: 7 10*3/uL (ref 4.0–10.5)

## 2021-05-11 LAB — IBC + FERRITIN
Ferritin: 92.9 ng/mL (ref 22.0–322.0)
Iron: 73 ug/dL (ref 42–165)
Saturation Ratios: 16.7 % — ABNORMAL LOW (ref 20.0–50.0)
Transferrin: 313 mg/dL (ref 212.0–360.0)

## 2021-05-11 LAB — VITAMIN B12: Vitamin B-12: 325 pg/mL (ref 211–911)

## 2021-05-11 LAB — T4, FREE: Free T4: 0.79 ng/dL (ref 0.60–1.60)

## 2021-05-11 LAB — LIPASE: Lipase: 26 U/L (ref 11.0–59.0)

## 2021-05-11 LAB — TSH: TSH: 1.51 u[IU]/mL (ref 0.35–4.50)

## 2021-05-11 LAB — T3, FREE: T3, Free: 4.3 pg/mL — ABNORMAL HIGH (ref 2.3–4.2)

## 2021-06-10 ENCOUNTER — Ambulatory Visit
Admission: RE | Admit: 2021-06-10 | Discharge: 2021-06-10 | Disposition: A | Discharge status: Home / Self Care | Payer: BC Managed Care – PPO | Source: Ambulatory Visit | Attending: Family Medicine | Admitting: Family Medicine

## 2021-06-10 DIAGNOSIS — R1011 Right upper quadrant pain: Secondary | ICD-10-CM

## 2021-06-10 DIAGNOSIS — K760 Fatty (change of) liver, not elsewhere classified: Secondary | ICD-10-CM | POA: Diagnosis not present

## 2021-08-14 ENCOUNTER — Telehealth: Payer: Self-pay | Admitting: Family Medicine

## 2021-08-14 DIAGNOSIS — J45990 Exercise induced bronchospasm: Secondary | ICD-10-CM

## 2021-08-17 NOTE — Telephone Encounter (Signed)
Pt needs to be set up with [employer]. Previous pt of [employer]

## 2021-08-18 NOTE — Telephone Encounter (Signed)
Called patient and he stated that he is going to call back due to he will be out of town for weeks at a time coming up

## 2021-09-09 ENCOUNTER — Other Ambulatory Visit: Payer: Self-pay | Admitting: Family Medicine

## 2021-09-09 DIAGNOSIS — J45990 Exercise induced bronchospasm: Secondary | ICD-10-CM

## 2021-10-22 ENCOUNTER — Other Ambulatory Visit: Payer: Self-pay | Admitting: Family

## 2021-12-07 ENCOUNTER — Ambulatory Visit: Payer: BC Managed Care – PPO | Admitting: Nurse Practitioner

## 2021-12-07 ENCOUNTER — Encounter: Payer: Self-pay | Admitting: Nurse Practitioner

## 2021-12-07 ENCOUNTER — Other Ambulatory Visit: Payer: Self-pay

## 2021-12-07 VITALS — BP 144/92 | HR 93 | Temp 97.9°F | Resp 12 | Ht 72.5 in | Wt 225.0 lb

## 2021-12-07 DIAGNOSIS — J302 Other seasonal allergic rhinitis: Secondary | ICD-10-CM | POA: Diagnosis not present

## 2021-12-07 DIAGNOSIS — Z0000 Encounter for general adult medical examination without abnormal findings: Secondary | ICD-10-CM | POA: Diagnosis not present

## 2021-12-07 DIAGNOSIS — K219 Gastro-esophageal reflux disease without esophagitis: Secondary | ICD-10-CM

## 2021-12-07 DIAGNOSIS — J45990 Exercise induced bronchospasm: Secondary | ICD-10-CM

## 2021-12-07 DIAGNOSIS — I10 Essential (primary) hypertension: Secondary | ICD-10-CM

## 2021-12-07 DIAGNOSIS — K5000 Crohn's disease of small intestine without complications: Secondary | ICD-10-CM

## 2021-12-07 LAB — COMPREHENSIVE METABOLIC PANEL
ALT: 54 U/L — ABNORMAL HIGH (ref 0–53)
AST: 28 U/L (ref 0–37)
Albumin: 4.6 g/dL (ref 3.5–5.2)
Alkaline Phosphatase: 68 U/L (ref 39–117)
BUN: 11 mg/dL (ref 6–23)
CO2: 31 mEq/L (ref 19–32)
Calcium: 9.9 mg/dL (ref 8.4–10.5)
Chloride: 100 mEq/L (ref 96–112)
Creatinine, Ser: 1 mg/dL (ref 0.40–1.50)
GFR: 100.29 mL/min (ref >60.00)
Glucose, Bld: 84 mg/dL (ref 70–99)
Potassium: 3.9 mEq/L (ref 3.5–5.1)
Sodium: 140 mEq/L (ref 135–145)
Total Bilirubin: 0.3 mg/dL (ref 0.2–1.2)
Total Protein: 7.4 g/dL (ref 6.0–8.3)

## 2021-12-07 LAB — CBC WITH DIFFERENTIAL/PLATELET
Basophils Absolute: 0 10*3/uL (ref 0.0–0.1)
Basophils Relative: 0.7 % (ref 0.0–3.0)
Eosinophils Absolute: 0.2 10*3/uL (ref 0.0–0.7)
Eosinophils Relative: 3.4 % (ref 0.0–5.0)
HCT: 43.2 % (ref 39.0–52.0)
Hemoglobin: 14.3 g/dL (ref 13.0–17.0)
Lymphocytes Relative: 35.7 % (ref 12.0–46.0)
Lymphs Abs: 2.3 10*3/uL (ref 0.7–4.0)
MCHC: 33 g/dL (ref 30.0–36.0)
MCV: 92.4 fl (ref 78.0–100.0)
Monocytes Absolute: 0.5 10*3/uL (ref 0.1–1.0)
Monocytes Relative: 7.6 % (ref 3.0–12.0)
Neutro Abs: 3.4 10*3/uL (ref 1.4–7.7)
Neutrophils Relative %: 52.6 % (ref 43.0–77.0)
Platelets: 337 10*3/uL (ref 150.0–400.0)
RBC: 4.68 Mil/uL (ref 4.22–5.81)
RDW: 13.2 % (ref 11.5–15.5)
WBC: 6.4 10*3/uL (ref 4.0–10.5)

## 2021-12-07 LAB — LIPID PANEL
Cholesterol: 201 mg/dL — ABNORMAL HIGH (ref 0–200)
HDL: 46 mg/dL (ref >39.00)
NonHDL: 155.34
Total CHOL/HDL Ratio: 4
Triglycerides: 250 mg/dL — ABNORMAL HIGH (ref 0.0–149.0)
VLDL: 50 mg/dL — ABNORMAL HIGH (ref 0.0–40.0)

## 2021-12-07 LAB — LDL CHOLESTEROL, DIRECT: Direct LDL: 124 mg/dL

## 2021-12-07 LAB — HEMOGLOBIN A1C: Hgb A1c MFr Bld: 5.5 % (ref 4.6–6.5)

## 2021-12-07 MED ORDER — LOSARTAN POTASSIUM-HCTZ 50-12.5 MG PO TABS: 2.0000 | ORAL_TABLET | Freq: Every day | ORAL | 1 refills | Status: DC

## 2021-12-07 NOTE — Patient Instructions (Addendum)
Nice to see you today Check your blood pressure 2-3 times weekly for me. Record them and send them to me If you do that we can see you once a year for now. Of course if you need me in between let me know Follow up 1 year, sooner if needed

## 2021-12-07 NOTE — Assessment & Plan Note (Signed)
Currently maintained on Flonase and second-generation oral antihistamine.  Patient may continue.  Did discuss possibilities of Flonase causing dry nasal passages and epistaxis.  Patient has not had trouble.  Continues on medications as directed

## 2021-12-07 NOTE — Assessment & Plan Note (Signed)
Patient states he has been on Nexium for extended period time.  He does have a GI provider but does not think he had endoscopy in the past.  Does get his medication over-the-counter.  Continue medication as beneficial follow-up with GI as recommended.

## 2021-12-07 NOTE — Assessment & Plan Note (Signed)
Encourage patient to get back into healthy lifestyle regimen.  He is currently working on it.

## 2021-12-07 NOTE — Progress Notes (Signed)
Established Patient Office Visit  Subjective:  Patient ID: Cameron Walker, male    DOB: 17-Dec-1990  Age: 31 y.o. MRN: [id number]  CC:  Chief Complaint  Patient presents with   Transfer of Care    HPI Cameron Walker presents for TOC/ CPE   for complete physical and follow up of chronic conditions.  Immunizations: -Tetanus: 2018 -Influenza: 09/2021 -Covid-19:Pfizer x2 and a moderna booster -Shingles: NA -Pneumonia: NA  -HPV:?  Diet: Fair diet. 2-3 meals a day. Little snacking in between. Black coffee and mostly water and sometimes tea. Cant do caffeine after 2 pm. Exercise: No regular exercise currently. But was walking 1 mile, then running 1 mile and then  walking 1 mine. Was weight lifting and doing cardio also.  Eye exam: Completes annually. Yearly needs updating  Dental exam: Completes semi-annually    Colonoscopy: Completed in 2020 followed by [facility] PSA: too young  Lung Cancer Screening: Non smoker   HTN: does check it at home but does check it at home weekly. No side effects  GI doctor: Ronnette Juniper, MD at [facility]. On mesalamine. States that he has been on Nexium since high school. States that he gets it over the counter   Exercise induced asthma: can take it when he feels tight and exercises.  Allergies: takes Claritin and Flonase daily  Past Medical History:  Diagnosis Date   Allergy    Crohn's disease ([place]) 05/11/2021   [facility] follows   GERD (gastroesophageal reflux disease)    Hypertension     Past Surgical History:  Procedure Laterality Date   SINUSOTOMY  2005    Family History  Problem Relation Age of Onset   Hypertension Father    Arthritis Maternal Grandmother    Diabetes Maternal Grandmother    Cancer Paternal Grandfather        throat   Stroke Neg Hx     Social History   Socioeconomic History   Marital status: Unknown    Spouse name: Not on file   Number of children: Not on file   Years of education: Not on file   Highest  education level: Not on file  Occupational History   Not on file  Tobacco Use   Smoking status: Never   Smokeless tobacco: Never  Substance and Sexual Activity   Alcohol use: Yes    Alcohol/week: 1.0 standard drink    Types: 1 Cans of beer per week    Comment: occasional   Drug use: No   Sexual activity: Yes  Other Topics Concern   Not on file  Social History Narrative   Not on file   Social Determinants of Health   Financial Resource Strain: Not on file  Food Insecurity: Not on file  Transportation Needs: Not on file  Physical Activity: Not on file  Stress: Not on file  Social Connections: Not on file  Intimate Partner Violence: Not on file    Outpatient Medications Prior to Visit  Medication Sig Dispense Refill   albuterol (VENTOLIN HFA) 108 (90 Base) MCG/ACT inhaler INHALE 2 PUFFS BY MOUTH EVERY 6 HOURS AS NEEDED FOR WHEEZING OR SHORTNESS OF BREATH OR COUGH 18 g 1   calcium carbonate (TUMS - DOSED IN MG ELEMENTAL CALCIUM) 500 MG chewable tablet 1 tablet     esomeprazole (NEXIUM) 20 MG capsule Take 20 mg by mouth daily as needed.      fluticasone (FLONASE) 50 MCG/ACT nasal spray Place 2 sprays into both  nostrils daily.     loratadine (CLARITIN) 10 MG tablet Take 10 mg by mouth daily.     losartan-hydrochlorothiazide (HYZAAR) 50-12.5 MG tablet TAKE 2 TABLETS BY MOUTH DAILY 180 tablet 1   mesalamine (APRISO) 0.375 g 24 hr capsule Take 1.5 g by mouth every morning.     No facility-administered medications prior to visit.    Allergies  Allergen Reactions   Cephalosporins    Sulfa Antibiotics Hives    ROS Review of Systems  Constitutional:  Negative for chills, fatigue and fever.  HENT:  Positive for postnasal drip. Negative for congestion, ear pain and sore throat.   Respiratory:  Positive for cough (improved). Negative for shortness of breath (DOE with extreme exertion).   Cardiovascular:  Negative for chest pain, palpitations and leg swelling.  Gastrointestinal:   Negative for abdominal pain, blood in stool, nausea and vomiting.       BMs several times daily  Genitourinary:  Negative for dysuria, frequency, hematuria, penile discharge, penile pain, penile swelling, scrotal swelling and testicular pain.       Nocturia x 1  Neurological:  Negative for dizziness, light-headedness, numbness and headaches.  Psychiatric/Behavioral:  Negative for hallucinations and suicidal ideas.      Objective:    Physical Exam Vitals and nursing note reviewed. Exam conducted with a chaperone present [facility] RMA).  Constitutional:      Appearance: Normal appearance.  HENT:     Right Ear: Tympanic membrane, ear canal and external ear normal.     Left Ear: Tympanic membrane, ear canal and external ear normal.     Mouth/Throat:     Mouth: Mucous membranes are moist.     Pharynx: Oropharynx is clear.  Eyes:     Extraocular Movements: Extraocular movements intact.     Pupils: Pupils are equal, round, and reactive to light.     Comments: Wears corrective lenses   Neck:     Thyroid: No thyroid mass, thyromegaly or thyroid tenderness.  Cardiovascular:     Rate and Rhythm: Normal rate and regular rhythm.     Pulses: Normal pulses.  Pulmonary:     Effort: Pulmonary effort is normal.     Breath sounds: Normal breath sounds.  Abdominal:     General: Bowel sounds are normal. There is no distension.     Palpations: There is no mass.     Tenderness: There is no abdominal tenderness.  Genitourinary:    Penis: Normal and circumcised.      Testes: Normal.  Musculoskeletal:     Right lower leg: No edema.     Left lower leg: No edema.  Lymphadenopathy:     Cervical: No cervical adenopathy.  Neurological:     General: No focal deficit present.     Mental Status: He is alert.  Psychiatric:        Mood and Affect: Mood normal.        Behavior: Behavior normal.        Thought Content: Thought content normal.        Judgment: Judgment normal.    BP (!)  144/92    Pulse 93    Temp 97.9 F (36.6 C)    Resp 12    Ht 6' 0.5" (1.842 m)    Wt 225 lb (102.1 kg)    SpO2 96%    BMI 30.10 kg/m  Wt Readings from Last 3 Encounters:  12/07/21 225 lb (102.1 kg)  05/11/21 223 lb 8 oz (  101.4 kg)  01/28/21 234 lb (106.1 kg)     Health Maintenance Due  Topic Date Due   Hepatitis C Screening  Never done   COVID-19 Vaccine (3 - Booster for Pfizer series) 04/16/2020    There are no preventive care reminders to display for this patient.  Lab Results  Component Value Date   TSH 1.51 05/11/2021   Lab Results  Component Value Date   WBC 7.0 05/11/2021   HGB 15.3 05/11/2021   HCT 44.7 05/11/2021   MCV 91.5 05/11/2021   PLT 340.0 05/11/2021   Lab Results  Component Value Date   NA 137 05/11/2021   K 4.3 05/11/2021   CO2 30 05/11/2021   GLUCOSE 89 05/11/2021   BUN 13 05/11/2021   CREATININE 0.98 05/11/2021   BILITOT 0.6 12/01/2017   ALKPHOS 65 12/01/2017   AST 19 12/01/2017   ALT 18 12/01/2017   PROT 7.2 12/01/2017   ALBUMIN 4.7 12/01/2017   CALCIUM 10.0 05/11/2021   GFR 103.17 05/11/2021   Lab Results  Component Value Date   CHOL 143 12/01/2017   Lab Results  Component Value Date   HDL 47.00 12/01/2017   Lab Results  Component Value Date   LDLCALC 79 12/01/2017   Lab Results  Component Value Date   TRIG 88.0 12/01/2017   Lab Results  Component Value Date   CHOLHDL 3 12/01/2017   Lab Results  Component Value Date   HGBA1C 5.4 11/30/2016      Assessment & Plan:   Problem List Items Addressed This Visit       Cardiovascular and Mediastinum   HTN (hypertension)    Patient currently maintained on losartan 100 mg-HCTZ 25 mg.  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  Last blood pressure in office within normal limits today slightly above normal limits.  Did encourage patient to check blood pressure at home and start his exercise routine  back.  Once these things happen I feel like he would be at goal but continue to monitor he can send me blood pressure readings via MyChart.      Relevant Medications   losartan-hydrochlorothiazide (HYZAAR) 50-12.5 MG tablet   Other Relevant Orders   CBC with Differential/Platelet   Comprehensive metabolic panel     Respiratory   Exercise-induced asthma    Use the inhaler only when needed.  States that he started feeling tightness just to take a couple puffs this relief.  May continue using albuterol as needed chest tightness/shortness of breath        Digestive   GERD (gastroesophageal reflux disease)    Patient states he has been on Nexium for extended period time.  He does have a GI provider but does not think he had endoscopy in the past.  Does get his medication over-the-counter.  Continue medication as beneficial follow-up with GI as recommended.        Other   Seasonal allergies    Currently maintained on Flonase and second-generation oral antihistamine.  Patient may continue.  Did discuss possibilities of Flonase causing dry nasal passages and epistaxis.  Patient has not had trouble.  Continues on medications as directed      Crohn's disease [facility])    Patient currently maintained on mesalamine.  States he is followed by [facility], will get record release signed today.  States last colonoscopy was in 2020  per his report.  Continue following up with GI as recommended and take mesalamine as they prescribed.      Preventative health care - Primary    Encourage patient to get back into healthy lifestyle regimen.  He is currently working on it.      Relevant Orders   CBC with Differential/Platelet   Comprehensive metabolic panel   Hemoglobin A1c   Lipid panel    No orders of the defined types were placed in this encounter.   Follow-up: Return in about 1 year (around 12/07/2022).   This visit occurred during the SARS-CoV-2 public health emergency.  Safety protocols were in  place, including screening questions prior to the visit, additional usage of staff PPE, and extensive cleaning of exam room while observing appropriate contact time as indicated for disinfecting solutions.   Romilda Garret, NP

## 2021-12-07 NOTE — Assessment & Plan Note (Signed)
Patient currently maintained on mesalamine.  States he is followed by [facility], will get record release signed today.  States last colonoscopy was in 2020 per his report.  Continue following up with GI as recommended and take mesalamine as they prescribed.

## 2021-12-07 NOTE — Assessment & Plan Note (Signed)
Use the inhaler only when needed.  States that he started feeling tightness just to take a couple puffs this relief.  May continue using albuterol as needed chest tightness/shortness of breath

## 2021-12-07 NOTE — Assessment & Plan Note (Signed)
Patient currently maintained on losartan 100 mg-HCTZ 25 mg.  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  Last blood pressure in office within normal limits today slightly above normal limits.  Did encourage patient to check blood pressure at home and start his exercise routine back.  Once these things happen I feel like he would be at goal but continue to monitor he can send me blood pressure readings via MyChart.

## 2021-12-14 ENCOUNTER — Telehealth: Payer: Self-pay

## 2021-12-14 NOTE — Telephone Encounter (Signed)
Left message for patient to call back. Received notification that patient may not have seen his lab results on mychart. See comments from provider below.  Cameron Walker,   I have reviewed your labs. Overall things look ok. One of your liver functions is slightly elevated. Kidneys look great. Red and white blood cells look good. Also we check an A1C once a year and it is normal. 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 Start working on [employer] and exercise like you discussed with me in office and we will recheck it in the future. Also don't forget to check your blood pressures at home 3 times weekly and send them to me through [place]. Let me know if you have any questions   Thanks, Romilda Garret, DNP, AGNP-C

## 2021-12-15 NOTE — Telephone Encounter (Signed)
Patient called back and spoke with Jori Moll, he was aware of the notes per epic, he has reviewed his labs and notes

## 2022-01-02 ENCOUNTER — Encounter: Payer: Self-pay | Admitting: Nurse Practitioner

## 2022-01-07 ENCOUNTER — Other Ambulatory Visit: Payer: Self-pay | Admitting: Nurse Practitioner

## 2022-01-07 DIAGNOSIS — I10 Essential (primary) hypertension: Secondary | ICD-10-CM

## 2022-01-07 MED ORDER — AMLODIPINE BESYLATE 5 MG PO TABS: 5.0000 mg | ORAL_TABLET | Freq: Every day | ORAL | 1 refills | Status: DC

## 2022-01-07 MED ORDER — LOSARTAN POTASSIUM-HCTZ 50-12.5 MG PO TABS: 2.0000 | ORAL_TABLET | Freq: Every day | ORAL | 1 refills | Status: DC

## 2022-03-09 ENCOUNTER — Encounter: Payer: Self-pay | Admitting: Nurse Practitioner

## 2022-07-05 ENCOUNTER — Encounter: Payer: Self-pay | Admitting: Nurse Practitioner

## 2022-07-05 ENCOUNTER — Ambulatory Visit: Payer: BC Managed Care – PPO | Admitting: Nurse Practitioner

## 2022-07-05 VITALS — BP 142/90 | HR 75 | Temp 97.5°F | Resp 12 | Ht 72.5 in | Wt 235.1 lb

## 2022-07-05 DIAGNOSIS — K5000 Crohn's disease of small intestine without complications: Secondary | ICD-10-CM

## 2022-07-05 DIAGNOSIS — I10 Essential (primary) hypertension: Secondary | ICD-10-CM | POA: Diagnosis not present

## 2022-07-05 DIAGNOSIS — L301 Dyshidrosis [pompholyx]: Secondary | ICD-10-CM | POA: Insufficient documentation

## 2022-07-05 MED ORDER — VALSARTAN-HYDROCHLOROTHIAZIDE 160-25 MG PO TABS
1.0000 | ORAL_TABLET | Freq: Every day | ORAL | 0 refills | Status: DC
Start: 2022-07-05 — End: 2022-10-06

## 2022-07-05 NOTE — Assessment & Plan Note (Signed)
Currently almost healed.  Patient declines any topical steroids at this current juncture.  Did discuss of use topical steroids that will thin the skin and cause lightening of the pigmentation with extended use

## 2022-07-05 NOTE — Assessment & Plan Note (Signed)
Patient has been maintained on losartan-hydrochlorothiazide 100 mg / 25 mg.  States he is checking blood pressure at home sometimes above goal sometimes borderline and sometimes within goal.  Slightly above target today.  We will switch patient from losartan HCTZ to valsartan HCTZ 160 mg - 25 mg.  We will check blood pressure daily for a week to make sure he tolerates it well.  Patient has a follow-up appoint with GI in 1 month with recheck labs and blood pressure did get medical record release signed in order to get that information.  Patient also to send me blood pressures via MyChart.

## 2022-07-05 NOTE — Assessment & Plan Note (Signed)
Being followed by [facility].  Has an appointment 1 month from today

## 2022-07-05 NOTE — Progress Notes (Signed)
Established Patient Office Visit  Subjective   Patient ID: Cameron Walker, male    DOB: 08-04-1991  Age: 31 y.o. MRN: [id number]  Chief Complaint  Patient presents with   Hypertension    Still staying elevated with activities, better at rest.   Rash    On side of the left hand and in between thumb and index finger on the left hand. Found out his dad has Dyshidrotic Eczema       HTN: States that he will check daily when you remember and sometimes throughout the day. States that he was dealing with some sinus issues. States that the numbers 135/85ish with resting. States with doing stuff 155/90-95. While he is doing certain acitives    Dr [facility]   Rash: Dyshidrodic eczema.  Has a rash that is intermittent that will come up during periods of stress.  He did speak with his father his father has a history of dyshidrotic eczema.  Upon exam this is fairly consistent patient's rash is almost gone.   Review of Systems  Constitutional:  Negative for chills and fever.  Eyes:  Negative for blurred vision and double vision.  Respiratory:  Negative for shortness of breath.   Cardiovascular:  Negative for chest pain and leg swelling.  Skin:  Positive for rash.  Neurological:  Negative for headaches.      Objective:     BP (!) 142/90   Pulse 75   Temp (!) 97.5 F (36.4 C)   Resp 12   Ht 6' 0.5" (1.842 m)   Wt 235 lb 2 oz (106.7 kg)   SpO2 97%   BMI 31.45 kg/m    Physical Exam Vitals and nursing note reviewed.  Constitutional:      Appearance: Normal appearance.  Cardiovascular:     Rate and Rhythm: Normal rate and regular rhythm.     Heart sounds: Normal heart sounds.  Pulmonary:     Effort: Pulmonary effort is normal.     Breath sounds: Normal breath sounds.  Musculoskeletal:     Right lower leg: No edema.     Left lower leg: No edema.  Skin:    General: Skin is warm.     Findings: Rash present.          Comments: Erythematous area.  With 3 spots where  vesicles were.  Mostly healed currently father has history of dyshidrotic eczema  Neurological:     Mental Status: He is alert.      No results found for any visits on 07/05/22.    The ASCVD Risk score (Arnett DK, et al., 2019) failed to calculate for the following reasons:   The 2019 ASCVD risk score is only valid for ages 70 to 50    Assessment & Plan:   Problem List Items Addressed This Visit       Cardiovascular and Mediastinum   HTN (hypertension)    Patient has been maintained on losartan-hydrochlorothiazide 100 mg / 25 mg.  States he is checking blood pressure at home sometimes above goal sometimes borderline and sometimes within goal.  Slightly above target today.  We will switch patient from losartan HCTZ to valsartan HCTZ 160 mg - 25 mg.  We will check blood pressure daily for a week to make sure he tolerates it well.  Patient has a follow-up appoint with GI in 1 month with recheck labs and blood pressure did get medical record release signed in order to get that information.  Patient also to send me blood pressures via MyChart.      Relevant Medications   valsartan-hydrochlorothiazide (DIOVAN-HCT) 160-25 MG tablet     Musculoskeletal and Integument   Dyshidrotic eczema - Primary    Currently almost healed.  Patient declines any topical steroids at this current juncture.  Did discuss of use topical steroids that will thin the skin and cause lightening of the pigmentation with extended use        Other   Crohn's disease ([facility])    Being followed by [facility].  Has an appointment 1 month from today       Return in about 5 months (around 12/05/2022) for CPE and labs.    Audria Nine, NP

## 2022-07-05 NOTE — Patient Instructions (Signed)
Nice to see you today We are going to STOP losartan-hydrochlorothiazide and start Valsartan- hydrochlorothiazide. Check your blood pressure daily and let me know the reading via mychart.  Since you are seeing [facility] in a month as long as they do blood work that looks at kidneys and potassium and check your blood pressure we will keep your follow up for your physical

## 2022-07-19 ENCOUNTER — Encounter: Payer: Self-pay | Admitting: Nurse Practitioner

## 2022-08-04 DIAGNOSIS — K529 Noninfective gastroenteritis and colitis, unspecified: Secondary | ICD-10-CM | POA: Diagnosis not present

## 2022-08-12 DIAGNOSIS — Z23 Encounter for immunization: Secondary | ICD-10-CM | POA: Diagnosis not present

## 2022-10-06 ENCOUNTER — Encounter: Payer: Self-pay | Admitting: Nurse Practitioner

## 2022-10-06 MED ORDER — LOSARTAN POTASSIUM-HCTZ 50-12.5 MG PO TABS: 2.0000 | ORAL_TABLET | Freq: Every day | ORAL | 0 refills | Status: DC

## 2022-10-06 NOTE — Telephone Encounter (Signed)
Patient scheduled.

## 2022-10-06 NOTE — Telephone Encounter (Signed)
Needs CPE next month

## 2022-11-26 ENCOUNTER — Encounter: Payer: BC Managed Care – PPO | Admitting: Nurse Practitioner

## 2022-12-16 ENCOUNTER — Encounter: Payer: Self-pay | Admitting: Nurse Practitioner

## 2022-12-16 ENCOUNTER — Ambulatory Visit (INDEPENDENT_AMBULATORY_CARE_PROVIDER_SITE_OTHER): Payer: BC Managed Care – PPO | Admitting: Nurse Practitioner

## 2022-12-16 VITALS — BP 128/78 | HR 79 | Ht 72.0 in | Wt 227.0 lb

## 2022-12-16 DIAGNOSIS — Z0000 Encounter for general adult medical examination without abnormal findings: Secondary | ICD-10-CM

## 2022-12-16 DIAGNOSIS — K5010 Crohn's disease of large intestine without complications: Secondary | ICD-10-CM | POA: Diagnosis not present

## 2022-12-16 DIAGNOSIS — J45990 Exercise induced bronchospasm: Secondary | ICD-10-CM

## 2022-12-16 DIAGNOSIS — I10 Essential (primary) hypertension: Secondary | ICD-10-CM

## 2022-12-16 DIAGNOSIS — K219 Gastro-esophageal reflux disease without esophagitis: Secondary | ICD-10-CM | POA: Diagnosis not present

## 2022-12-16 DIAGNOSIS — E669 Obesity, unspecified: Secondary | ICD-10-CM | POA: Diagnosis not present

## 2022-12-16 LAB — COMPREHENSIVE METABOLIC PANEL
ALT: 41 U/L (ref 0–53)
AST: 23 U/L (ref 0–37)
Albumin: 4.7 g/dL (ref 3.5–5.2)
Alkaline Phosphatase: 67 U/L (ref 39–117)
BUN: 12 mg/dL (ref 6–23)
CO2: 31 mEq/L (ref 19–32)
Calcium: 9.6 mg/dL (ref 8.4–10.5)
Chloride: 97 mEq/L (ref 96–112)
Creatinine, Ser: 0.98 mg/dL (ref 0.40–1.50)
GFR: 102.02 mL/min (ref >60.00)
Glucose, Bld: 96 mg/dL (ref 70–99)
Potassium: 3.9 mEq/L (ref 3.5–5.1)
Sodium: 135 mEq/L (ref 135–145)
Total Bilirubin: 0.6 mg/dL (ref 0.2–1.2)
Total Protein: 6.9 g/dL (ref 6.0–8.3)

## 2022-12-16 LAB — CBC
HCT: 43 % (ref 39.0–52.0)
Hemoglobin: 14.8 g/dL (ref 13.0–17.0)
MCHC: 34.4 g/dL (ref 30.0–36.0)
MCV: 91.2 fl (ref 78.0–100.0)
Platelets: 361 10*3/uL (ref 150.0–400.0)
RBC: 4.71 Mil/uL (ref 4.22–5.81)
RDW: 13.6 % (ref 11.5–15.5)
WBC: 5.6 10*3/uL (ref 4.0–10.5)

## 2022-12-16 LAB — LIPID PANEL
Cholesterol: 172 mg/dL (ref 0–200)
HDL: 46 mg/dL (ref >39.00)
LDL Cholesterol: 100 mg/dL — ABNORMAL HIGH (ref 0–99)
NonHDL: 126.07
Total CHOL/HDL Ratio: 4
Triglycerides: 129 mg/dL (ref 0.0–149.0)
VLDL: 25.8 mg/dL (ref 0.0–40.0)

## 2022-12-16 LAB — HEMOGLOBIN A1C: Hgb A1c MFr Bld: 5.6 % (ref 4.6–6.5)

## 2022-12-16 LAB — TSH: TSH: 1.45 u[IU]/mL (ref 0.35–5.50)

## 2022-12-16 MED ORDER — LOSARTAN POTASSIUM-HCTZ 100-12.5 MG PO TABS: 1.0000 | ORAL_TABLET | Freq: Every day | ORAL | 1 refills | Status: DC

## 2022-12-16 NOTE — Progress Notes (Signed)
Established Patient Office Visit  Subjective   Patient ID: Cameron Walker, male    DOB: 1991/05/30  Age: 32 y.o. MRN: [id number]  Chief Complaint  Patient presents with   Annual Exam    HPI  for complete physical and follow up of chronic conditions.   HTN: Maintainted on losartan- hctz. We did switch him to valsartan hctz but patient states that it caused a flare in his crohn's. Does have a cuff and will check his blood pressure 2-3 times a week.  He did request if we could decrease the amount of diuretic.  States that he does have constipation with his Crohn's at times and he drinks a lot of water but the diuretic dose set him behind.  Asthma: exercised induced. Has not used albuterol inhaler in approx two years  GERD: on nexium prn. States that he avoids spicy food.  Crohn's: Dr. Mary Walker through [facility]. Mesalamine. Sees then yearly at [facility]  Immunizations: -Tetanus: Completed in 2018 -Influenza: Completed this season 08/12/2022 -Shingles: too young -Pneumonia: too young   -HPV:  Diet: Fair diet. States that he tries to do a small breakfast with sanck, lunch, snack, and then dinner. Water  Exercise: No regular exercise. Working on the house. 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  Dental exam: Completes semi-annually   Colonoscopy: Completed in 1.5-2 years ago states that he feels like he is coming due for another one Lung Cancer Screening: N/A Dexa: N/A  PSA: Too young, currently average risk  Sleep: States that he goes to bed around 1030 and gets up around 5a,. Does not feel rested. States that he does not know if he snore.  He feels like the nonrestorative sleep is due to to his current work schedule.      Review of Systems  Constitutional:  Negative for chills and fever.  Respiratory:  Negative for shortness of breath.   Cardiovascular:  Negative for chest pain  and leg swelling.  Gastrointestinal:  Negative for abdominal pain, blood in stool, constipation, diarrhea, nausea and vomiting.       BM twice a day Formed soft. Sometimes liquid  Genitourinary:  Negative for dysuria and hematuria.  Neurological:  Negative for tingling and headaches.  Psychiatric/Behavioral:  Negative for hallucinations and suicidal ideas.       Objective:     BP 128/78   Pulse 79   Ht 6' (1.829 m)   Wt 227 lb (103 kg)   SpO2 97%   BMI 30.79 kg/m  BP Readings from Last 3 Encounters:  12/16/22 128/78  07/05/22 (!) 142/90  12/07/21 (!) 144/92   Wt Readings from Last 3 Encounters:  12/16/22 227 lb (103 kg)  07/05/22 235 lb 2 oz (106.7 kg)  12/07/21 225 lb (102.1 kg)      Physical Exam Vitals and nursing note reviewed. Exam conducted with a chaperone present Cameron Walker, CMA).  Constitutional:      Appearance: Normal appearance.  HENT:     Right Ear: Tympanic membrane, ear canal and external ear normal.     Left Ear: Tympanic membrane, ear canal and external ear normal.     Mouth/Throat:     Mouth: Mucous membranes are moist.     Pharynx: Oropharynx is clear.  Eyes:     Extraocular Movements: Extraocular movements intact.     Pupils: Pupils are equal, round, and reactive  to light.  Cardiovascular:     Rate and Rhythm: Normal rate and regular rhythm.     Pulses: Normal pulses.     Heart sounds: Normal heart sounds.  Pulmonary:     Effort: Pulmonary effort is normal.     Breath sounds: Normal breath sounds.  Abdominal:     General: Bowel sounds are normal. There is no distension.     Palpations: There is no mass.     Tenderness: There is no abdominal tenderness.     Hernia: No hernia is present. There is no hernia in the left inguinal area or right inguinal area.  Genitourinary:    Penis: Normal.      Testes: Normal.     Epididymis:     Right: Normal.     Left: Normal.  Musculoskeletal:     Right lower leg: No edema.     Left lower leg:  No edema.  Lymphadenopathy:     Cervical: No cervical adenopathy.     Lower Body: No right inguinal adenopathy. No left inguinal adenopathy.  Skin:    General: Skin is warm.  Neurological:     General: No focal deficit present.     Mental Status: He is alert.     Deep Tendon Reflexes:     Reflex Scores:      Bicep reflexes are 2+ on the right side and 2+ on the left side.      Patellar reflexes are 2+ on the right side and 2+ on the left side.    Comments: Bilateral upper and lower extremity strength 5/5  Psychiatric:        Mood and Affect: Mood normal.        Behavior: Behavior normal.        Thought Content: Thought content normal.        Judgment: Judgment normal.      No results found for any visits on 12/16/22.    The ASCVD Risk score (Arnett DK, et al., 2019) failed to calculate for the following reasons:   The 2019 ASCVD risk score is only valid for ages 66 to 24    Assessment & Plan:   Problem List Items Addressed This Visit       Cardiovascular and Mediastinum   HTN (hypertension)    Patient was maintained on losartan 100-hydrochlorothiazide 25 mg.  We will titrate patient to losartan 100 mg with 12.5 mg of HCTZ to aid out in constipation prevention.  He will continue checking his blood pressure at home will follow-up with me in 3 months for blood pressure recheck      Relevant Medications   losartan-hydrochlorothiazide (HYZAAR) 100-12.5 MG tablet   Other Relevant Orders   CBC   Comprehensive metabolic panel   TSH     Respiratory   Exercise-induced asthma    Stable.  Patient has not used inhaler approximately 2 years.        Digestive   GERD (gastroesophageal reflux disease)    Patient will use Nexium on a as needed basis.  He does avoid spicy foods.  Stable there is a family history of throat cancer and Barrett's esophagus in the family        Other   Crohn's disease [facility])    [facility] is followed [facility].  Currently on mesalamine.  Continue  following with gastroenterology as recommended taking medication as prescribed.      Relevant Orders   CBC   Comprehensive metabolic panel  TSH   Preventative health care - Primary    Discussed age-appropriate immunizations and screening exams.  Patient tetanus shot, flu shot is up-to-date.  Patient had a colonoscopy so far in life but he is too young to qualify for CRC screening.  Too young for PSA screening also.  Patient was given information at discharge about preventative healthcare maintenance with anticipatory guidance for his age range.  Encouraged healthy lifestyle modifications      Relevant Orders   CBC   Lipid panel   Comprehensive metabolic panel   TSH   Hemoglobin A1c   Obesity (BMI 30-39.9)    Encouraged healthy lifestyle modifications inclusive of 30 minutes of exercise 5 times a week.  Patient may start out slowly work with 10 to 15 minutes of exercise and 1-2 times a week and build up from there      Relevant Orders   Lipid panel   Hemoglobin A1c    Return in about 3 months (around 03/16/2023) for BP recheck.    Romilda Garret, NP

## 2022-12-16 NOTE — Assessment & Plan Note (Signed)
Patient will use Nexium on a as needed basis.  He does avoid spicy foods.  Stable there is a family history of throat cancer and Barrett's esophagus in the family

## 2022-12-16 NOTE — Assessment & Plan Note (Signed)
Stable.  Patient has not used inhaler approximately 2 years.

## 2022-12-16 NOTE — Assessment & Plan Note (Signed)
Patient is followed by [facility].  Currently on mesalamine.  Continue following with gastroenterology as recommended taking medication as prescribed.

## 2022-12-16 NOTE — Assessment & Plan Note (Signed)
Discussed age-appropriate immunizations and screening exams.  Patient tetanus shot, flu shot is up-to-date.  Patient had a colonoscopy so far in life but he is too young to qualify for CRC screening.  Too young for PSA screening also.  Patient was given information at discharge about preventative healthcare maintenance with anticipatory guidance for his age range.  Encouraged healthy lifestyle modifications

## 2022-12-16 NOTE — Assessment & Plan Note (Signed)
Encouraged healthy lifestyle modifications inclusive of 30 minutes of exercise 5 times a week.  Patient may start out slowly work with 10 to 15 minutes of exercise and 1-2 times a week and build up from there

## 2022-12-16 NOTE — Patient Instructions (Signed)
Nice to see you today I will be in touch with the labs once I have them Follow up with me in 3 months for a blood pressure recheck Work on exercise outside of work. The recommendation is 30 mins a day 5 times a week

## 2022-12-16 NOTE — Assessment & Plan Note (Signed)
Patient was maintained on losartan 100-hydrochlorothiazide 25 mg.  We will titrate patient to losartan 100 mg with 12.5 mg of HCTZ to aid out in constipation prevention.  He will continue checking his blood pressure at home will follow-up with me in 3 months for blood pressure recheck

## 2023-03-18 ENCOUNTER — Ambulatory Visit: Payer: BC Managed Care – PPO | Admitting: Nurse Practitioner

## 2023-03-18 ENCOUNTER — Encounter: Payer: Self-pay | Admitting: Nurse Practitioner

## 2023-03-18 VITALS — BP 130/74 | HR 80 | Temp 98.1°F | Resp 16 | Ht 72.0 in | Wt 232.0 lb

## 2023-03-18 DIAGNOSIS — E669 Obesity, unspecified: Secondary | ICD-10-CM | POA: Diagnosis not present

## 2023-03-18 DIAGNOSIS — I10 Essential (primary) hypertension: Secondary | ICD-10-CM

## 2023-03-18 NOTE — Patient Instructions (Addendum)
Nice to see you today We will keep your blood pressure medication the same since it came down with recheck. Follow up with me in 9 months for you next physical

## 2023-03-18 NOTE — Assessment & Plan Note (Signed)
Patient is mentioned he is cutting back alcohol and going to try to cut back on caffeine.  Continue working on healthy lifestyle modifications to aid in hypertension control
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
## 2023-03-18 NOTE — Assessment & Plan Note (Addendum)
Patient currently maintained on losartan-hydrochlorothiazide 100-12.5 mg daily.  Blood pressure was elevated at beginning of office visit within normal limits upon recheck.  Patient did bring log of blood pressures 50-50 with good readings in bad readings.  Continue medication as prescribed

## 2023-05-31 ENCOUNTER — Other Ambulatory Visit: Payer: Self-pay

## 2023-05-31 DIAGNOSIS — I10 Essential (primary) hypertension: Secondary | ICD-10-CM

## 2023-05-31 MED ORDER — LOSARTAN POTASSIUM-HCTZ 100-12.5 MG PO TABS: 1.0000 | ORAL_TABLET | Freq: Every day | ORAL | 1 refills | Status: DC

## 2023-05-31 NOTE — Telephone Encounter (Signed)
LOSARTAN 100-12.5 mg    LAST APPOINTMENT DATE: 03/18/2023   NEXT APPOINTMENT DATE: 12/28/23    LAST REFILL: 12/16/22  QTY: #90 1RF

## 2023-08-19 DIAGNOSIS — K5090 Crohn's disease, unspecified, without complications: Secondary | ICD-10-CM | POA: Diagnosis not present

## 2023-10-18 DIAGNOSIS — K529 Noninfective gastroenteritis and colitis, unspecified: Secondary | ICD-10-CM | POA: Diagnosis not present

## 2023-10-19 DIAGNOSIS — K529 Noninfective gastroenteritis and colitis, unspecified: Secondary | ICD-10-CM | POA: Diagnosis not present

## 2023-11-29 ENCOUNTER — Other Ambulatory Visit: Payer: Self-pay | Admitting: Nurse Practitioner

## 2023-11-29 DIAGNOSIS — I10 Essential (primary) hypertension: Secondary | ICD-10-CM

## 2023-12-28 ENCOUNTER — Encounter: Payer: Self-pay | Admitting: Nurse Practitioner

## 2023-12-28 ENCOUNTER — Ambulatory Visit (INDEPENDENT_AMBULATORY_CARE_PROVIDER_SITE_OTHER): Payer: BC Managed Care – PPO | Admitting: Nurse Practitioner

## 2023-12-28 VITALS — BP 130/80 | HR 70 | Temp 98.4°F | Ht 72.0 in | Wt 232.0 lb

## 2023-12-28 DIAGNOSIS — K5010 Crohn's disease of large intestine without complications: Secondary | ICD-10-CM | POA: Diagnosis not present

## 2023-12-28 DIAGNOSIS — Z113 Encounter for screening for infections with a predominantly sexual mode of transmission: Secondary | ICD-10-CM

## 2023-12-28 DIAGNOSIS — K219 Gastro-esophageal reflux disease without esophagitis: Secondary | ICD-10-CM

## 2023-12-28 DIAGNOSIS — Z0000 Encounter for general adult medical examination without abnormal findings: Secondary | ICD-10-CM

## 2023-12-28 DIAGNOSIS — Z1159 Encounter for screening for other viral diseases: Secondary | ICD-10-CM | POA: Diagnosis not present

## 2023-12-28 DIAGNOSIS — J45990 Exercise induced bronchospasm: Secondary | ICD-10-CM | POA: Diagnosis not present

## 2023-12-28 DIAGNOSIS — E669 Obesity, unspecified: Secondary | ICD-10-CM

## 2023-12-28 DIAGNOSIS — I10 Essential (primary) hypertension: Secondary | ICD-10-CM | POA: Diagnosis not present

## 2023-12-28 LAB — LIPID PANEL
Cholesterol: 198 mg/dL (ref 0–200)
HDL: 52.2 mg/dL (ref >39.00)
LDL Cholesterol: 106 mg/dL — ABNORMAL HIGH (ref 0–99)
NonHDL: 145.71
Total CHOL/HDL Ratio: 4
Triglycerides: 198 mg/dL — ABNORMAL HIGH (ref 0.0–149.0)
VLDL: 39.6 mg/dL (ref 0.0–40.0)

## 2023-12-28 LAB — COMPREHENSIVE METABOLIC PANEL
ALT: 42 U/L (ref 0–53)
AST: 23 U/L (ref 0–37)
Albumin: 4.8 g/dL (ref 3.5–5.2)
Alkaline Phosphatase: 66 U/L (ref 39–117)
BUN: 13 mg/dL (ref 6–23)
CO2: 31 meq/L (ref 19–32)
Calcium: 9.5 mg/dL (ref 8.4–10.5)
Chloride: 97 meq/L (ref 96–112)
Creatinine, Ser: 0.88 mg/dL (ref 0.40–1.50)
GFR: 112.94 mL/min (ref >60.00)
Glucose, Bld: 98 mg/dL (ref 70–99)
Potassium: 4 meq/L (ref 3.5–5.1)
Sodium: 135 meq/L (ref 135–145)
Total Bilirubin: 0.8 mg/dL (ref 0.2–1.2)
Total Protein: 7.3 g/dL (ref 6.0–8.3)

## 2023-12-28 LAB — TSH: TSH: 1.36 u[IU]/mL (ref 0.35–5.50)

## 2023-12-28 LAB — CBC
HCT: 44 % (ref 39.0–52.0)
Hemoglobin: 14.7 g/dL (ref 13.0–17.0)
MCHC: 33.5 g/dL (ref 30.0–36.0)
MCV: 93.3 fL (ref 78.0–100.0)
Platelets: 319 10*3/uL (ref 150.0–400.0)
RBC: 4.71 Mil/uL (ref 4.22–5.81)
RDW: 13.6 % (ref 11.5–15.5)
WBC: 5 10*3/uL (ref 4.0–10.5)

## 2023-12-28 NOTE — Assessment & Plan Note (Addendum)
Discussed age-appropriate immunizations and screening exams.  Did review patient's personal, surgical, social, family histories.  Patient is up-to-date on all age-appropriate vaccinations she would like.  Discussed STI testing. Pt declined GC/Chlamydia, but did want blood tests for HIV and syphilis. Pt will consider follow up in the year for Prevnar vaccine. Patient was given information at discharge about preventative healthcare maintenance with anticipatory guidance. Labs pending.     I evaluated patient, was consulted regarding treatment, and agree with assessment and plan per Denice Bors RN, FNP Student   Audria Nine, DNP, AGNP-C

## 2023-12-28 NOTE — Assessment & Plan Note (Addendum)
Controlled with esomeprazole 20 mg daily and Tums PRN.   I evaluated patient, was consulted regarding treatment, and agree with assessment and plan per Denice Bors RN, FNP Student   Audria Nine, DNP, AGNP-C

## 2023-12-28 NOTE — Patient Instructions (Signed)
It was a pleasure seeing you today.  We will follow up with you once your labs result.  We will see you again in 1 year for your annual physical.

## 2023-12-28 NOTE — Assessment & Plan Note (Addendum)
Followed by [facility]. Labs pending continue continue taking mesalamine medication as prescribed  I evaluated patient, was consulted regarding treatment, and agree with assessment and plan per Denice Bors RN, FNP Student   Audria Nine, DNP, AGNP-C
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
## 2023-12-28 NOTE — Assessment & Plan Note (Addendum)
Labs pending.  Continue working on lifestyle modifications  I evaluated patient, was consulted regarding treatment, and agree with assessment and plan per Denice Bors RN, FNP Student   Audria Nine, DNP, AGNP-C

## 2023-12-28 NOTE — Assessment & Plan Note (Addendum)
Continue Hyzaar 100-12.5 mg daily. Instructed pt on proper BP technique and to monitor at home and notify us if it is consistently over 140/90. Labs pending  I evaluated patient, was consulted regarding treatment, and agree with assessment and plan per Denice Bors RN, FNP Student   Audria Nine, DNP, AGNP-C
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
## 2023-12-28 NOTE — Assessment & Plan Note (Addendum)
Controlled with alubuterol MDI PRN.   I evaluated patient, was consulted regarding treatment, and agree with assessment and plan per Denice Bors RN, FNP Student   Audria Nine, DNP, AGNP-C

## 2023-12-29 LAB — HEPATITIS C ANTIBODY: Hepatitis C Ab: NONREACTIVE

## 2023-12-29 LAB — HIV ANTIBODY (ROUTINE TESTING W REFLEX): HIV 1&2 Ab, 4th Generation: NONREACTIVE

## 2023-12-30 DIAGNOSIS — D224 Melanocytic nevi of scalp and neck: Secondary | ICD-10-CM | POA: Diagnosis not present

## 2023-12-30 DIAGNOSIS — D2262 Melanocytic nevi of left upper limb, including shoulder: Secondary | ICD-10-CM | POA: Diagnosis not present

## 2023-12-30 DIAGNOSIS — D225 Melanocytic nevi of trunk: Secondary | ICD-10-CM | POA: Diagnosis not present

## 2024-01-02 ENCOUNTER — Encounter: Payer: Self-pay | Admitting: Nurse Practitioner

## 2024-06-07 ENCOUNTER — Other Ambulatory Visit: Payer: Self-pay | Admitting: Nurse Practitioner

## 2024-06-07 DIAGNOSIS — I10 Essential (primary) hypertension: Secondary | ICD-10-CM

## 2024-06-12 DIAGNOSIS — H43812 Vitreous degeneration, left eye: Secondary | ICD-10-CM | POA: Diagnosis not present

## 2024-08-20 DIAGNOSIS — J019 Acute sinusitis, unspecified: Secondary | ICD-10-CM | POA: Diagnosis not present

## 2024-08-27 ENCOUNTER — Ambulatory Visit: Payer: Self-pay

## 2024-08-27 ENCOUNTER — Ambulatory Visit (INDEPENDENT_AMBULATORY_CARE_PROVIDER_SITE_OTHER): Admitting: Internal Medicine

## 2024-08-27 VITALS — BP 122/86 | HR 85 | Temp 99.0°F | Resp 17 | Ht 72.0 in | Wt 238.4 lb

## 2024-08-27 DIAGNOSIS — I10 Essential (primary) hypertension: Secondary | ICD-10-CM

## 2024-08-27 DIAGNOSIS — R051 Acute cough: Secondary | ICD-10-CM

## 2024-08-27 MED ORDER — AZELASTINE HCL 0.1 % NA SOLN: 1.0000 | Freq: Two times a day (BID) | NASAL | 0 refills | Status: DC

## 2024-08-27 MED ORDER — DOXYCYCLINE HYCLATE 100 MG PO TABS: 100.0000 mg | ORAL_TABLET | Freq: Two times a day (BID) | ORAL | 0 refills | Status: DC

## 2024-08-27 MED ORDER — PREDNISONE 10 MG PO TABS: ORAL_TABLET | ORAL | 0 refills | Status: DC

## 2024-08-27 NOTE — Telephone Encounter (Signed)
 Noted. I appreciate Dr. Freda evaluation

## 2024-08-27 NOTE — Telephone Encounter (Signed)
 FYI Only or Action Required?: FYI only for provider.  Patient was last seen in primary care on 12/28/2023 by Wendee Lynwood HERO, NP.  Called Nurse Triage reporting Shortness of Breath.  Symptoms began 08/20/2024 and worsening x 1 week.  Interventions attempted: Other: asthma inhaler & telehealth visit on 08/20/2024.  Symptoms are: gradually worsening.  Triage Disposition: See HCP Within 4 Hours (Or PCP Triage)  Patient/caregiver understands and will follow disposition?: Yes   Copied from CRM [id number]. Topic: Clinical - Red Word Triage >> Aug 27, 2024  8:02 AM Donna BRAVO wrote: Red Word that prompted transfer to Nurse Triage: patient had telehealth on 08/20/24 Symptoms: Productive cough - light green Wheezing when breathe Sinus drainage Week - little energy unable to walk a flight of stairs, starts to wheezing bad. Reason for Disposition  [1] MILD difficulty breathing (e.g., minimal/no SOB at rest, SOB with walking, pulse < 100) AND [2] NEW-onset or WORSE than normal  Answer Assessment - Initial Assessment Questions 1. RESPIRATORY STATUS: Describe your breathing? (e.g., wheezing, shortness of breath, unable to speak, severe coughing)      SOB & wheezing w/exertion, coughing-severe with light green mucous 2. ONSET: When did this breathing problem begin?      08/20/2024 and worsening x 1week 3. PATTERN Does the difficult breathing come and go, or has it been constant since it started?      With  4. SEVERITY: How bad is your breathing? (e.g., mild, moderate, severe)      Mild to moderate 5. RECURRENT SYMPTOM: Have you had difficulty breathing before? If Yes, ask: When was the last time? and What happened that time?      no 6. CARDIAC HISTORY: Do you have any history of heart disease? (e.g., heart attack, angina, bypass surgery, angioplasty)      na 7. LUNG HISTORY: Do you have any history of lung disease?  (e.g., pulmonary embolus, asthma, emphysema)     asthma 8.  CAUSE: What do you think is causing the breathing problem?      Cold like s/s 9. OTHER SYMPTOMS: Do you have any other symptoms? (e.g., chest pain, cough, dizziness, fever, runny nose)     Post nasal drip 10. O2 SATURATION MONITOR:  Do you use an oxygen saturation monitor (pulse oximeter) at home? If Yes, ask: What is your reading (oxygen level) today? What is your usual oxygen saturation reading? (e.g., 95%)       na 11. PREGNANCY: Is there any chance you are pregnant? When was your last menstrual period?       na 12. TRAVEL: Have you traveled out of the country in the last month? (e.g., travel history, exposures)       Na Pt c/o Weakness, low energy.  Protocols used: Breathing Difficulty-A-AH

## 2024-08-27 NOTE — Patient Instructions (Signed)
 Continue flonase and mucinex as you are doing.   Rescue inhaler as needed.   Prednisone  taper as directed.  Continue the antibiotics.   Astelin nasal spray - 1 spray each nostril two times per day.

## 2024-08-27 NOTE — Progress Notes (Unsigned)
 Subjective:    Patient ID: Cameron Walker, male    DOB: 10/25/1991, 33 y.o.   MRN: [id number]  Patient here for  Chief Complaint  Patient presents with   URI    C/O SOB & Wheezing/DOE despite recent abx treatment.     HPI Work in appt. Work in with concerns regarding persistent increased cough and congestion. Reports symptoms started 08/09/24. Coworkers had been sick with covid. He developed headache, burning throat and increased mucus production. Started taking otc medication - mucinex, phenylephrine and using nasal spray - flonase. Symptoms persisted. Evaluated 08/20/24 - telehealth - prescribed doxycycline , tessalon perles and albuterol  inhaler. Reports symptoms have improved, but still with increased mucus production, drainage and increased cough. Reports coughing fits. Also noticing some wheezing. Using albuterol  q 4 hours. No headache now. Today - mucus more clear. Previous green mucus. Still with drainage and increased mucus. No chest tightness. No vomiting.    Past Medical History:  Diagnosis Date   Allergy    Asthma    Crohn's disease (HCC) 05/11/2021   [facility] follows   GERD (gastroesophageal reflux disease)    Hypertension    Past Surgical History:  Procedure Laterality Date   SINUSOTOMY  11/16/2003   WISDOM TOOTH EXTRACTION     Family History  Problem Relation Age of Onset   Arthritis Mother    Bowel Disease Mother    Hypertension Father    Other Father        Dyshidrotic Eczema   Diabetes Sister    Barrett's esophagus Paternal Uncle    Other Maternal Grandmother    Arthritis Maternal Grandmother    Diabetes Maternal Grandmother    Cancer Maternal Grandfather    Cancer Paternal Grandfather        throat   Stroke Neg Hx    Social History   Socioeconomic History   Marital status: Single    Spouse name: Not on file   Number of children: 0   Years of education: Not on file   Highest education level: Bachelor's degree (e.g., BA, AB, BS)  Occupational  History   Not on file  Tobacco Use   Smoking status: Never   Smokeless tobacco: Never  Vaping Use   Vaping status: Never Used  Substance and Sexual Activity   Alcohol use: Yes    Alcohol/week: 6.0 standard drinks of alcohol    Types: 6 Cans of beer per week    Comment: once a week 1-2 beers   Drug use: No   Sexual activity: Not Currently  Other Topics Concern   Not on file  Social History Narrative   Fulltime:Controls Art gallery manager       Hobbies: gaming, fixing his house   Social Drivers of Health   Financial Resource Strain: Low Risk  (03/17/2023)   Overall Financial Resource Strain (CARDIA)    Difficulty of Paying Living Expenses: Not very hard  Food Insecurity: No Food Insecurity (03/17/2023)   Hunger Vital Sign    Worried About Running Out of Food in the Last Year: Never true    Ran Out of Food in the Last Year: Never true  Transportation Needs: No Transportation Needs (03/17/2023)   PRAPARE - Administrator, Civil Service (Medical): No    Lack of Transportation (Non-Medical): No  Physical Activity: Insufficiently Active (03/17/2023)   Exercise Vital Sign    Days of Exercise per Week: 3 days    Minutes of Exercise per Session: 20 min  Stress: Stress Concern Present (03/17/2023)   [employer] of Occupational Health - Occupational Stress Questionnaire    Feeling of Stress : To some extent  Social Connections: Moderately Isolated (03/17/2023)   Social Connection and Isolation Panel    Frequency of Communication with Friends and Family: More than three times a week    Frequency of Social Gatherings with Friends and Family: Three times a week    Attends Religious Services: 1 to 4 times per year    Active Member of Clubs or Organizations: No    Attends Engineer, structural: Not on file    Marital Status: Never married     Review of Systems  Constitutional:  Negative for appetite change, fever and unexpected weight change.  HENT:  Positive for congestion  and postnasal drip.        Previous sore throat.   Respiratory:  Positive for cough and wheezing. Negative for chest tightness.   Cardiovascular:  Negative for chest pain, palpitations and leg swelling.  Gastrointestinal:  Negative for abdominal pain, diarrhea and vomiting.  Genitourinary:  Negative for difficulty urinating and dysuria.  Musculoskeletal:  Negative for joint swelling and myalgias.  Skin:  Negative for color change and rash.  Neurological:  Negative for dizziness.       No headache now.   Psychiatric/Behavioral:  Negative for agitation and dysphoric mood.        Objective:     BP 122/86 (Patient Position: Sitting, Cuff Size: Normal)   Pulse 85   Temp 99 F (37.2 C) (Oral)   Resp 17   Ht 6' (1.829 m)   Wt 238 lb 6 oz (108.1 kg)   SpO2 96%   BMI 32.33 kg/m  Wt Readings from Last 3 Encounters:  08/27/24 238 lb 6 oz (108.1 kg)  12/28/23 232 lb (105.2 kg)  03/18/23 232 lb (105.2 kg)    Physical Exam Constitutional:      General: He is not in acute distress.    Appearance: Normal appearance. He is well-developed.  HENT:     Head: Normocephalic and atraumatic.     Right Ear: External ear normal.     Left Ear: External ear normal.  Eyes:     General: No scleral icterus.       Right eye: No discharge.        Left eye: No discharge.  Cardiovascular:     Rate and Rhythm: Normal rate and regular rhythm.  Pulmonary:     Effort: Pulmonary effort is normal.     Comments: Increased air movement. Increased cough with forced expiration.  Abdominal:     General: Bowel sounds are normal.     Palpations: Abdomen is soft.     Tenderness: There is no abdominal tenderness.  Musculoskeletal:        General: No swelling or tenderness.     Cervical back: Neck supple. No tenderness.  Lymphadenopathy:     Cervical: No cervical adenopathy.  Skin:    Findings: No erythema or rash.  Neurological:     Mental Status: He is alert.  Psychiatric:        Mood and Affect: Mood  normal.        Behavior: Behavior normal.         Outpatient Encounter Medications as of 08/27/2024  Medication Sig   albuterol  (VENTOLIN  HFA) 108 (90 Base) MCG/ACT inhaler INHALE 2 PUFFS BY MOUTH EVERY 6 HOURS AS NEEDED FOR WHEEZING OR SHORTNESS OF BREATH OR  COUGH   azelastine (ASTELIN) 0.1 % nasal spray Place 1 spray into both nostrils 2 (two) times daily. Use in each nostril as directed   benzonatate (TESSALON) 100 MG capsule Take 100 mg by mouth 3 (three) times daily as needed for cough.   calcium carbonate (TUMS - DOSED IN MG ELEMENTAL CALCIUM) 500 MG chewable tablet 1 tablet   doxycycline  (VIBRA -TABS) 100 MG tablet Take 1 tablet (100 mg total) by mouth 2 (two) times daily.   esomeprazole (NEXIUM) 20 MG capsule Take 20 mg by mouth daily as needed.    fluticasone (FLONASE) 50 MCG/ACT nasal spray Place 2 sprays into both nostrils daily.   GUAIFENESIN 1200 PO Take 1,200 mg by mouth 2 (two) times daily.   loratadine (CLARITIN) 10 MG tablet Take 10 mg by mouth daily.   losartan -hydrochlorothiazide  (HYZAAR) 100-12.5 MG tablet TAKE 1 TABLET BY MOUTH EVERY DAY   mesalamine (APRISO) 0.375 g 24 hr capsule Take 1.5 g by mouth every morning.   predniSONE  (DELTASONE ) 10 MG tablet Take 4 tablets x 1 day and then decrease by 1/2 tablet per day until down to zero mg.   pseudoephedrine (SUDAFED) 120 MG 12 hr tablet Take 120 mg by mouth 2 (two) times daily.   No facility-administered encounter medications on file as of 08/27/2024.     Lab Results  Component Value Date   WBC 5.0 12/28/2023   HGB 14.7 12/28/2023   HCT 44.0 12/28/2023   PLT 319.0 12/28/2023   GLUCOSE 98 12/28/2023   CHOL 198 12/28/2023   TRIG 198.0 (H) 12/28/2023   HDL 52.20 12/28/2023   LDLDIRECT 124.0 12/07/2021   LDLCALC 106 (H) 12/28/2023   ALT 42 12/28/2023   AST 23 12/28/2023   NA 135 12/28/2023   K 4.0 12/28/2023   CL 97 12/28/2023   CREATININE 0.88 12/28/2023   BUN 13 12/28/2023   CO2 31 12/28/2023   TSH 1.36  12/28/2023   HGBA1C 5.6 12/16/2022    US  ABDOMEN LIMITED RUQ (LIVER/GB) Result Date: 06/10/2021 CLINICAL DATA:  Right upper quadrant pain for 4 weeks History of Crohn's EXAM: ULTRASOUND ABDOMEN LIMITED RIGHT UPPER QUADRANT COMPARISON:  CT abdomen and pelvis 08/31/2018 FINDINGS: Gallbladder: No gallstones or wall thickening visualized. No sonographic Murphy sign noted by sonographer. Common bile duct: Diameter: 2 mm Liver: No focal lesion. Diffusely increased parenchymal echogenicity. Portal vein is patent on color Doppler imaging with normal direction of blood flow towards the liver. Other: None. IMPRESSION: Diffuse increased echogenicity of the hepatic parenchyma is a nonspecific indicator of hepatocellular dysfunction, most commonly steatosis. Electronically Signed   By: Aliene Lloyd M.D.   On: 06/10/2021 14:31       Assessment & Plan:  Acute cough Assessment & Plan: Persistent cough and congestion as outlined. Covid tested himself 2-3 x with beginning of symptoms. Coughing fits. He is doing better overall. Currently on doxycycline . Took last dose this am. Discussed prednisone  therapy. He reports some intolerance to higher doses of prednisone  and states he needs a tapering dose. Will treat with prednisone  taper as directed. Extend doxycycline  x 1 week. Continue flonase nasal spray and mucinex as directed. Continue albuterol  inhaler. Discussed cxr. He wants to hold at this time. Follow. Call with update.    Primary hypertension Assessment & Plan: Blood pressure as outlined. Continues on hyzaar.    Other orders -     predniSONE ; Take 4 tablets x 1 day and then decrease by 1/2 tablet per day until down to zero mg.  Dispense: 18 tablet; Refill: 0 -     Doxycycline  Hyclate; Take 1 tablet (100 mg total) by mouth 2 (two) times daily.  Dispense: 14 tablet; Refill: 0 -     Azelastine HCl; Place 1 spray into both nostrils 2 (two) times daily. Use in each nostril as directed  Dispense: 30 mL; Refill:  0     Allena Hamilton, MD

## 2024-08-28 ENCOUNTER — Encounter: Payer: Self-pay | Admitting: Internal Medicine

## 2024-08-28 ENCOUNTER — Ambulatory Visit (INDEPENDENT_AMBULATORY_CARE_PROVIDER_SITE_OTHER)
Admission: RE | Admit: 2024-08-28 | Discharge: 2024-08-28 | Disposition: A | Discharge status: Home / Self Care | Source: Ambulatory Visit | Attending: Internal Medicine | Admitting: Internal Medicine

## 2024-08-28 ENCOUNTER — Ambulatory Visit: Payer: Self-pay | Admitting: Internal Medicine

## 2024-08-28 ENCOUNTER — Ambulatory Visit: Payer: Self-pay

## 2024-08-28 DIAGNOSIS — R053 Chronic cough: Secondary | ICD-10-CM

## 2024-08-28 DIAGNOSIS — R062 Wheezing: Secondary | ICD-10-CM | POA: Diagnosis not present

## 2024-08-28 NOTE — Assessment & Plan Note (Signed)
 Blood pressure as outlined. Continues on hyzaar.

## 2024-08-28 NOTE — Telephone Encounter (Signed)
 Please call and confirm he is doing ok. I am ok with ordering cxr. (I have ordered). He will need to go to outpatient imaging. Let me know if any problems.

## 2024-08-28 NOTE — Addendum Note (Signed)
 Addended by: GLENDIA ALLENA RAMAN on: 08/28/2024 01:30 PM   Modules accepted: Orders

## 2024-08-28 NOTE — Telephone Encounter (Signed)
 Patient is going to [facility] today to have his xray done.

## 2024-08-28 NOTE — Telephone Encounter (Signed)
 FYI Only or Action Required?: Action required by provider: clinical question for provider.  Patient was last seen in primary care on 08/27/2024 by Glendia Shad, MD.  Called Nurse Triage reporting Advice Only.  Symptoms began today.  Triage Disposition: Information or Advice Only Call  Patient/caregiver understands and will follow disposition?: No, wishes to speak with PCP     Copied from CRM [id number]. Topic: Clinical - Request for Lab/Test Order >> Aug 28, 2024 10:51 AM Mercedes MATSU wrote: Reason for CRM: Patient called in stating that his wheezing and coughing has gotten worse, and he is requesting a xray order be placed. Patient is requesting a call back from [facility]'s nurse and can be reached at [phone].   Reason for Disposition  Health information question, no triage required and triager able to answer question  Answer Assessment - Initial Assessment Questions 1. REASON FOR CALL: What is the main reason for your call? or How can I best help you?     LOV yesterday Dr. Glendia wanted to put an order in for chest x-ray but pt declined.  Pt stated after going home, rethinking and waking up this morning feeling bad/worse: pt would like to request order for chest xray.  Pt is requesting office call him back to notify if chest xray ordered and where to go.  Protocols used: Information Only Call - No Triage-A-AH

## 2024-08-28 NOTE — Assessment & Plan Note (Addendum)
 Persistent cough and congestion as outlined. Covid tested himself 2-3 x with beginning of symptoms. Coughing fits. He is doing better overall. Currently on doxycycline . Took last dose this am. Discussed prednisone  therapy. He reports some intolerance to higher doses of prednisone  and states he needs a tapering dose. Will treat with prednisone  taper as directed. Extend doxycycline  x 1 week. Continue flonase nasal spray and mucinex as directed. Continue albuterol  inhaler. Discussed cxr. He wants to hold at this time. Follow. Call with update.

## 2024-08-28 NOTE — Telephone Encounter (Signed)
 I did not see the patient for this. I know he was just seen by Dr. Glendia. I will send her a message  Also see if he has picked up and started the prednisone 

## 2024-10-25 DIAGNOSIS — K529 Noninfective gastroenteritis and colitis, unspecified: Secondary | ICD-10-CM | POA: Diagnosis not present

## 2024-10-25 DIAGNOSIS — R635 Abnormal weight gain: Secondary | ICD-10-CM | POA: Diagnosis not present

## 2024-10-30 ENCOUNTER — Other Ambulatory Visit: Payer: Self-pay | Admitting: Gastroenterology

## 2024-10-30 DIAGNOSIS — R7989 Other specified abnormal findings of blood chemistry: Secondary | ICD-10-CM

## 2024-11-01 DIAGNOSIS — K529 Noninfective gastroenteritis and colitis, unspecified: Secondary | ICD-10-CM | POA: Diagnosis not present

## 2024-11-13 ENCOUNTER — Ambulatory Visit: Admitting: Family Medicine

## 2024-11-13 ENCOUNTER — Encounter: Payer: Self-pay | Admitting: Family Medicine

## 2024-11-13 VITALS — BP 102/60 | HR 92 | Temp 98.9°F | Ht 72.0 in | Wt 245.1 lb

## 2024-11-13 DIAGNOSIS — J0140 Acute pansinusitis, unspecified: Secondary | ICD-10-CM

## 2024-11-13 MED ORDER — AMOXICILLIN 500 MG PO CAPS: 1000.0000 mg | ORAL_CAPSULE | Freq: Two times a day (BID) | ORAL | 0 refills | Status: AC

## 2024-11-13 MED ORDER — PREDNISONE 10 MG PO TABS: ORAL_TABLET | ORAL | 0 refills | Status: AC

## 2024-11-13 MED ORDER — AZELASTINE HCL 0.1 % NA SOLN: 1.0000 | Freq: Two times a day (BID) | NASAL | 0 refills | Status: AC

## 2024-11-13 NOTE — Assessment & Plan Note (Signed)
"   Acute, likely initial viral infection now with bacterial superinfection. Continue allergy regimen including Flonase.  Status post decongestant course. Treat with amoxicillin  500 mg 2 tablets twice daily for 10 days.  Prednisone  taper given for inflammation and facial pressure.  Return and ER precautions provided. "

## 2024-11-13 NOTE — Progress Notes (Signed)
 "   Patient ID: Cameron Walker, male    DOB: Jun 03, 1991, 33 y.o.   MRN: [id number]  This visit was conducted in person.  BP 102/60   Pulse 92   Temp 98.9 F (37.2 C) (Oral)   Ht 6' (1.829 m)   Wt 245 lb 2 oz (111.2 kg)   SpO2 98%   BMI 33.24 kg/m    CC:  Chief Complaint  Patient presents with   Sinusitis    Started 1 week ago Negative Covid Test   Sinus Drainage   Cough   Wheezing    Subjective:   HPI: Cameron Walker is a 33 y.o. male presenting on 11/13/2024 for Sinusitis (Started 1 week ago/Negative Covid Test), Sinus Drainage, Cough, and Wheezing PCP: [facility]  Date of onset:  1 week Initial symptoms included  sinus congestion and drainage Symptoms progressed to  occ cough ,  occ wheeze  Yellow phlegm.. darker green  Fatigue  Constant headache, facial pain  No SOB.     Sick contacts:  COVID testing:   negative     He has tried to treat with  cold and flu.. sudafed  Azelastine  nasal spray, flonase, Claritin.  Has not required albuterol      No history of chronic lung disease except exercise induced asthma Non-smoker.       Relevant past medical, surgical, family and social history reviewed and updated as indicated. Interim medical history since our last visit reviewed. Allergies and medications reviewed and updated. Outpatient Medications Prior to Visit  Medication Sig Dispense Refill   albuterol  (VENTOLIN  HFA) 108 (90 Base) MCG/ACT inhaler INHALE 2 PUFFS BY MOUTH EVERY 6 HOURS AS NEEDED FOR WHEEZING OR SHORTNESS OF BREATH OR COUGH 18 g 1   calcium carbonate (TUMS - DOSED IN MG ELEMENTAL CALCIUM) 500 MG chewable tablet 1 tablet     esomeprazole (NEXIUM) 20 MG capsule Take 20 mg by mouth daily as needed.      fluticasone (FLONASE) 50 MCG/ACT nasal spray Place 2 sprays into both nostrils daily.     GUAIFENESIN 1200 PO Take 1,200 mg by mouth 2 (two) times daily.     loratadine (CLARITIN) 10 MG tablet Take 10 mg by mouth daily.     losartan -hydrochlorothiazide   (HYZAAR) 100-12.5 MG tablet TAKE 1 TABLET BY MOUTH EVERY DAY 90 tablet 1   mesalamine (APRISO) 0.375 g 24 hr capsule Take 1.5 g by mouth every morning.     pseudoephedrine (SUDAFED) 120 MG 12 hr tablet Take 120 mg by mouth 2 (two) times daily.     azelastine  (ASTELIN ) 0.1 % nasal spray Place 1 spray into both nostrils 2 (two) times daily. Use in each nostril as directed 30 mL 0   benzonatate (TESSALON) 100 MG capsule Take 100 mg by mouth 3 (three) times daily as needed for cough.     doxycycline  (VIBRA -TABS) 100 MG tablet Take 1 tablet (100 mg total) by mouth 2 (two) times daily. 14 tablet 0   predniSONE  (DELTASONE ) 10 MG tablet Take 4 tablets x 1 day and then decrease by 1/2 tablet per day until down to zero mg. 18 tablet 0   No facility-administered medications prior to visit.     Per HPI unless specifically indicated in ROS section below Review of Systems Objective:  BP 102/60   Pulse 92   Temp 98.9 F (37.2 C) (Oral)   Ht 6' (1.829 m)   Wt 245 lb 2 oz (111.2 kg)   SpO2 98%  BMI 33.24 kg/m   Wt Readings from Last 3 Encounters:  11/13/24 245 lb 2 oz (111.2 kg)  08/27/24 238 lb 6 oz (108.1 kg)  12/28/23 232 lb (105.2 kg)      Physical Exam    Results for orders placed or performed in visit on 12/28/23  Comprehensive metabolic panel   Collection Time: 12/28/23  9:55 AM  Result Value Ref Range   Sodium 135 135 - 145 mEq/L   Potassium 4.0 3.5 - 5.1 mEq/L   Chloride 97 96 - 112 mEq/L   CO2 31 19 - 32 mEq/L   Glucose, Bld 98 70 - 99 mg/dL   BUN 13 6 - 23 mg/dL   Creatinine, Ser 9.11 0.40 - 1.50 mg/dL   Total Bilirubin 0.8 0.2 - 1.2 mg/dL   Alkaline Phosphatase 66 39 - 117 U/L   AST 23 0 - 37 U/L   ALT 42 0 - 53 U/L   Total Protein 7.3 6.0 - 8.3 g/dL   Albumin 4.8 3.5 - 5.2 g/dL   GFR 887.05 >39.99 mL/min   Calcium 9.5 8.4 - 10.5 mg/dL  Lipid panel   Collection Time: 12/28/23  9:55 AM  Result Value Ref Range   Cholesterol 198 0 - 200 mg/dL   Triglycerides 801.9 (H)  0.0 - 149.0 mg/dL   HDL 47.79 >60.99 mg/dL   VLDL 60.3 0.0 - 59.9 mg/dL   LDL Cholesterol 893 (H) 0 - 99 mg/dL   Total CHOL/HDL Ratio 4    NonHDL 145.71   TSH   Collection Time: 12/28/23  9:55 AM  Result Value Ref Range   TSH 1.36 0.35 - 5.50 uIU/mL  CBC   Collection Time: 12/28/23  9:55 AM  Result Value Ref Range   WBC 5.0 4.0 - 10.5 K/uL   RBC 4.71 4.22 - 5.81 Mil/uL   Platelets 319.0 150.0 - 400.0 K/uL   Hemoglobin 14.7 13.0 - 17.0 g/dL   HCT 55.9 60.9 - 47.9 %   MCV 93.3 78.0 - 100.0 fl   MCHC 33.5 30.0 - 36.0 g/dL   RDW 86.3 88.4 - 84.4 %  Hepatitis C Antibody   Collection Time: 12/28/23  9:55 AM  Result Value Ref Range   Hepatitis C Ab NON-REACTIVE NON-REACTIVE  HIV antibody (with reflex)   Collection Time: 12/28/23  9:55 AM  Result Value Ref Range   HIV 1&2 Ab, 4th Generation NON-REACTIVE NON-REACTIVE    Assessment and Plan  Acute non-recurrent pansinusitis Assessment & Plan:  Acute, likely initial viral infection now with bacterial superinfection. Continue allergy regimen including Flonase.  Status post decongestant course. Treat with amoxicillin  500 mg 2 tablets twice daily for 10 days.  Prednisone  taper given for inflammation and facial pressure.  Return and ER precautions provided.   Other orders -     Azelastine  HCl; Place 1 spray into both nostrils 2 (two) times daily. Use in each nostril as directed  Dispense: 30 mL; Refill: 0 -     Amoxicillin ; Take 2 capsules (1,000 mg total) by mouth 2 (two) times daily.  Dispense: 40 capsule; Refill: 0 -     predniSONE ; 4 tablets x 1 days, then decrease half a tablet per day until down to zero.  Dispense: 18 tablet; Refill: 0    No follow-ups on file.   Greig Ring, MD  "

## 2024-11-16 ENCOUNTER — Other Ambulatory Visit

## 2024-12-02 ENCOUNTER — Other Ambulatory Visit: Payer: Self-pay | Admitting: Nurse Practitioner

## 2024-12-02 DIAGNOSIS — I10 Essential (primary) hypertension: Secondary | ICD-10-CM

## 2024-12-03 NOTE — Telephone Encounter (Signed)
 Patient needs to be scheduled for CPE on 12/28/2024 or a week or so after to continue getting refills

## 2024-12-07 ENCOUNTER — Ambulatory Visit
Admission: RE | Admit: 2024-12-07 | Discharge: 2024-12-07 | Disposition: A | Source: Ambulatory Visit | Attending: Gastroenterology

## 2024-12-07 DIAGNOSIS — R7989 Other specified abnormal findings of blood chemistry: Secondary | ICD-10-CM

## 2025-01-03 ENCOUNTER — Encounter: Admitting: Nurse Practitioner
